# Patient Record
Sex: Female | Born: 1990 | Race: Black or African American | Hispanic: No | Marital: Married | Smoking: Never smoker
Health system: Southern US, Community
[De-identification: ages and names within clinical notes are randomized; demographics above are authoritative.]

## PROBLEM LIST (undated history)

## (undated) ENCOUNTER — Inpatient Hospital Stay (HOSPITAL_COMMUNITY): Payer: Self-pay

## (undated) DIAGNOSIS — Z789 Other specified health status: Secondary | ICD-10-CM

---

## 2012-12-26 ENCOUNTER — Encounter (HOSPITAL_COMMUNITY): Payer: Self-pay | Admitting: *Deleted

## 2012-12-26 ENCOUNTER — Emergency Department (HOSPITAL_COMMUNITY)
Admission: EM | Admit: 2012-12-26 | Discharge: 2012-12-26 | Disposition: A | Discharge status: Home / Self Care | Payer: BC Managed Care – PPO | Attending: Emergency Medicine | Admitting: Emergency Medicine

## 2012-12-26 ENCOUNTER — Emergency Department (HOSPITAL_COMMUNITY): Payer: BC Managed Care – PPO

## 2012-12-26 DIAGNOSIS — R209 Unspecified disturbances of skin sensation: Secondary | ICD-10-CM | POA: Insufficient documentation

## 2012-12-26 DIAGNOSIS — Y939 Activity, unspecified: Secondary | ICD-10-CM | POA: Insufficient documentation

## 2012-12-26 DIAGNOSIS — S83004A Unspecified dislocation of right patella, initial encounter: Secondary | ICD-10-CM

## 2012-12-26 DIAGNOSIS — M25461 Effusion, right knee: Secondary | ICD-10-CM

## 2012-12-26 DIAGNOSIS — M6281 Muscle weakness (generalized): Secondary | ICD-10-CM | POA: Insufficient documentation

## 2012-12-26 DIAGNOSIS — M25469 Effusion, unspecified knee: Secondary | ICD-10-CM | POA: Insufficient documentation

## 2012-12-26 DIAGNOSIS — S83006A Unspecified dislocation of unspecified patella, initial encounter: Secondary | ICD-10-CM | POA: Insufficient documentation

## 2012-12-26 DIAGNOSIS — Y929 Unspecified place or not applicable: Secondary | ICD-10-CM | POA: Insufficient documentation

## 2012-12-26 DIAGNOSIS — X500XXA Overexertion from strenuous movement or load, initial encounter: Secondary | ICD-10-CM | POA: Insufficient documentation

## 2012-12-26 MED ORDER — HYDROCODONE-ACETAMINOPHEN 5-325 MG PO TABS: 2.0000 | ORAL_TABLET | ORAL | Status: DC | PRN

## 2012-12-26 NOTE — ED Notes (Signed)
Reports that right knee cap slid out of place and pt fell to ground and now having pain. Swelling but No deformity noted at triage.

## 2012-12-26 NOTE — ED Notes (Signed)
Pt st's she turned from a standing position and right knee cap slid out of place.  Pt st's she fell to ground and knee cap went back into place.  Pt c/o pain in right knee with weight bearing at this time. No obvious deformity noted.

## 2012-12-26 NOTE — Progress Notes (Signed)
Orthopedic Tech Progress Note Patient Details:  Alexandria Ellis [birth year] [id number]  Ortho Devices Type of Ortho Device: Crutches;Knee Immobilizer Ortho Device/Splint Location: right leg Ortho Device/Splint Interventions: Application   Nikki Dom 12/26/2012, 10:51 PM

## 2012-12-26 NOTE — ED Provider Notes (Signed)
Medical screening examination/treatment/procedure(s) were conducted as a shared visit with non-physician practitioner(s) and myself.  I personally evaluated the patient during the encounter   Loren Racer, MD 12/26/12 [phone]

## 2012-12-26 NOTE — ED Provider Notes (Signed)
History    This chart was scribed for non-physician practitioner Dierdre Forth, PA-C working with Loren Racer, MD by Gerlean Ren, ED Scribe. This patient was seen in room TR11C/TR11C and the patient's care was started at 7:31 PM.    CSN: [id number]  Arrival date & time 12/26/12  1843   First MD Initiated Contact with Patient 12/26/12 1924      Chief Complaint  Patient presents with  . Knee Pain     The history is provided by the patient. No language interpreter was used.  Alexandria Ellis is a 22 y.o. female who presents to the Emergency Department complaining of sudden onset right knee pain at 5:30 PM today when pt rotated position while standing causing her knee cap to "slide out of place."  Pt reports her leg gave out on her at the time and she fell but did not sustain any injuries as result of fall.  Pt reports some numbness in her leg currently.  Pt denies any prior right knee injuries but states that her knee cap has slid out of place previously with similar movements.  Pt denies pain in lower back.  History reviewed. No pertinent past medical history.  History reviewed. No pertinent past surgical history.  History reviewed. No pertinent family history.  History  Substance Use Topics  . Smoking status: Not on file  . Smokeless tobacco: Not on file  . Alcohol Use: No    No OB history provided.   Review of Systems  Musculoskeletal: Positive for arthralgias (right knee).  Neurological: Positive for weakness and numbness.    Allergies  Review of patient's allergies indicates no known allergies.  Home Medications   Current Outpatient Rx  Name  Route  Sig  Dispense  Refill  . HYDROcodone-acetaminophen (NORCO/VICODIN) 5-325 MG per tablet   Oral   Take 2 tablets by mouth every 4 (four) hours as needed for pain.   10 tablet   0     BP 120/77  Pulse 78  Temp(Src) 98 F (36.7 C) (Oral)  Resp 16  SpO2 99%  LMP 12/23/2012  Physical Exam  Nursing note  and vitals reviewed. Constitutional: She is oriented to person, place, and time. She appears well-developed and well-nourished. No distress.  HENT:  Head: Normocephalic and atraumatic.  Eyes: Conjunctivae and EOM are normal.  Neck: Normal range of motion. Neck supple. No tracheal deviation present.  Cardiovascular: Normal rate, regular rhythm, normal heart sounds and intact distal pulses.   Pulses:      Dorsalis pedis pulses are 2+ on the right side, and 2+ on the left side.       Posterior tibial pulses are 2+ on the right side, and 2+ on the left side.  Capillary refill < 3 sec  Pulmonary/Chest: Effort normal and breath sounds normal. No respiratory distress.  Musculoskeletal: She exhibits tenderness. She exhibits no edema.       Right knee: She exhibits decreased range of motion, swelling and effusion. She exhibits no ecchymosis, no deformity, no laceration, no erythema, normal alignment and no bony tenderness. Tenderness found. Medial joint line tenderness noted.  Palpable effusion on right knee, decreased ROM of the Right knee 2/2 to pain  Neurological: She is alert and oriented to person, place, and time. Coordination normal. GCS eye subscore is 4. GCS verbal subscore is 5. GCS motor subscore is 6.  Reflex Scores:      Patellar reflexes are 2+ on the left side.  Achilles reflexes are 2+ on the left side. Weak with dorsiflexion and plantarflexion of left foot, 3/5 strength in right leg, unable to ambulate, absent sensation in L4 and L5 distribution below the knee, unable to assess reflex in right knee secondary to pain  Skin: Skin is warm and dry. She is not diaphoretic. No erythema.  No tenting of the skin, no erythema  Psychiatric: She has a normal mood and affect. Her behavior is normal.    ED Course  Procedures (including critical care time) DIAGNOSTIC STUDIES: Oxygen Saturation is 99% on room air, normal by my interpretation.    COORDINATION OF CARE: 7:50 PM- Informed pt  that I will have attending assess her due to sensory deficits.  Pt verbalizes understanding and agrees. Dg Knee Complete 4 Views Right  12/26/2012  *RADIOLOGY REPORT*  Clinical Data: Right knee pain.  RIGHT KNEE - COMPLETE 4+ VIEW  Comparison: None.  Findings: The right knee is located.  There is no significant effusion.  No acute bone or soft tissue abnormality is present.  IMPRESSION: Negative right knee.   Original Report Authenticated By: Marin Roberts, M.D.      1. Patellar dislocation, right, initial encounter   2. Knee effusion, right       MDM  Alexandria Ellis presents with knee injury, likely patella dislocation and subsequent knee effusion.  Patient X-Ray negative for obvious fracture or dislocation.  I personally reviewed the imaging tests through PACS system.  I reviewed available ER/hospitalization records through the EMR.  Pt also with mild swelling to the joint spaces, knee swelling, tightness in the knee, and mildly restricted range of motion. Pt unable to perform full flexion of the knee.  Pt is without systemic symptoms, erythema or redness of the joint consistent with gout or septic joint.  Pt with decreased sensation to the anterior and medial  right lower leg after the dislocation.  She is unable to ambulate.  Pt discussed with Dr Ranell Patrick who will see her in clinic tomorrow for follow-up.  Pain managed in ED. Pt advised to follow up with orthopedics if symptoms persist for possibility of missed fracture diagnosis. Patient given brace and crutches while in ED, conservative therapy recommended and discussed. Pt is to be nonweightbearing until evaluated by ortho.  Patient will be dc home & is agreeable with above plan.    I personally performed the services described in this documentation, which was scribed in my presence. The recorded information has been reviewed and is accurate.   Alexandria Client Belkis Norbeck, PA-C 12/26/12 2217

## 2013-01-06 ENCOUNTER — Emergency Department (HOSPITAL_COMMUNITY)
Admission: EM | Admit: 2013-01-06 | Discharge: 2013-01-06 | Disposition: A | Discharge status: Home / Self Care | Payer: BC Managed Care – PPO | Attending: Emergency Medicine | Admitting: Emergency Medicine

## 2013-01-06 ENCOUNTER — Emergency Department (HOSPITAL_COMMUNITY): Payer: BC Managed Care – PPO

## 2013-01-06 ENCOUNTER — Encounter (HOSPITAL_COMMUNITY): Payer: Self-pay | Admitting: Family Medicine

## 2013-01-06 DIAGNOSIS — Z87828 Personal history of other (healed) physical injury and trauma: Secondary | ICD-10-CM | POA: Insufficient documentation

## 2013-01-06 DIAGNOSIS — M25469 Effusion, unspecified knee: Secondary | ICD-10-CM | POA: Insufficient documentation

## 2013-01-06 DIAGNOSIS — M25461 Effusion, right knee: Secondary | ICD-10-CM

## 2013-01-06 NOTE — ED Provider Notes (Signed)
History    This chart was scribed for non-physician practitioner working with Shelda Jakes, MD by Frederik Pear, ED Scribe. This patient was seen in room TR08C/TR08C and the patient's care was started at 1647.   CSN: [id number]  Arrival date & time 01/06/13  1538   First MD Initiated Contact with Patient 01/06/13 1647      Chief Complaint  Patient presents with  . Joint Swelling    (Consider location/radiation/quality/duration/timing/severity/associated sxs/prior treatment) The history is provided by the patient and medical records. No language interpreter was used.   Alexandria Ellis is a 22 y.o. female who presents to the Emergency Department with a chief complaint of sudden onset,  6/10 right knee swelling that is aggravated by bearing weight and alleviated by nothing that began 2 weeks ago when she went to change directions while standing, and her knee slid out of place. She was evaluated in the ED on 04/02 and diagnosed with a right patellar dislocation and knee effusion and discharged with Vicodin and a follow up with Dr. Ranell Patrick on 04/03. In ED, she is using a knee immobilizer and crutches.   History reviewed. No pertinent past medical history.  History reviewed. No pertinent past surgical history.  History reviewed. No pertinent family history.  History  Substance Use Topics  . Smoking status: Not on file  . Smokeless tobacco: Not on file  . Alcohol Use: No    OB History   Grav Para Term Preterm Abortions TAB SAB Ect Mult Living                  Review of Systems A complete 10 system review of systems was obtained and all systems are negative except as noted in the HPI and PMH.  Allergies  Review of patient's allergies indicates no known allergies.  Home Medications   Current Outpatient Rx  Name  Route  Sig  Dispense  Refill  . HYDROcodone-acetaminophen (NORCO/VICODIN) 5-325 MG per tablet   Oral   Take 2 tablets by mouth every 4 (four) hours as needed  for pain.   10 tablet   0     BP 105/84  Pulse 81  Resp 18  SpO2 98%  LMP 01/06/2013  Physical Exam  Nursing note and vitals reviewed. Constitutional: She is oriented to person, place, and time. She appears well-developed and well-nourished. No distress.  HENT:  Head: Normocephalic and atraumatic.  Eyes: EOM are normal. Pupils are equal, round, and reactive to light.  Neck: Normal range of motion. Neck supple. No tracheal deviation present.  Cardiovascular: Normal rate.   Pulmonary/Chest: Effort normal. No respiratory distress.  Abdominal: Soft. She exhibits no distension.  Musculoskeletal: Normal range of motion. She exhibits no edema.  Right knee effusion mildly tender to palpation. No bony tenderness.   Neurological: She is alert and oriented to person, place, and time.  Skin: Skin is warm and dry.  Psychiatric: She has a normal mood and affect. Her behavior is normal.    ED Course  Procedures (including critical care time)  DIAGNOSTIC STUDIES: Oxygen Saturation is 98% on room air, normal by my interpretation.    COORDINATION OF CARE:  17:58- Discussed planned course of treatment with the patient, including an ortho referral, who is agreeable at this time.  Labs Reviewed - No data to display Dg Knee Complete 4 Views Right  01/06/2013  *RADIOLOGY REPORT*  Clinical Data: Right knee dislocation 2 weeks ago, pain/swelling  RIGHT KNEE - COMPLETE 4+ VIEW  Comparison: 12/26/2012  Findings: No fracture or dislocation is seen.  The joint spaces are preserved.  Moderate suprapatellar knee joint effusion.  IMPRESSION: No fracture or dislocation is seen.  Moderate suprapatellar knee joint effusion.   Original Report Authenticated By: Charline Bills, M.D.      1. Knee effusion, right       MDM  Uncomplicated knee effusion.  Patient using crutches and knee immobilizer. Ortho follow-up.  I personally performed the services described in this documentation, which was scribed  in my presence. The recorded information has been reviewed and is accurate.         Roxy Horseman, PA-C 01/07/13 0003

## 2013-01-06 NOTE — ED Notes (Signed)
Patient transported to X-ray 

## 2013-01-06 NOTE — ED Notes (Signed)
Per pt sts right knee swelling. Pt here a week ago with knee dislocation and fluid.

## 2013-01-08 NOTE — ED Provider Notes (Signed)
Medical screening examination/treatment/procedure(s) were performed by non-physician practitioner and as supervising physician I was immediately available for consultation/collaboration.    Loistine Eberlin W. Radhika Dershem, MD 01/08/13 1334 

## 2013-01-29 ENCOUNTER — Emergency Department (HOSPITAL_COMMUNITY)
Admission: EM | Admit: 2013-01-29 | Discharge: 2013-01-29 | Disposition: A | Discharge status: Home / Self Care | Payer: BC Managed Care – PPO | Attending: Emergency Medicine | Admitting: Emergency Medicine

## 2013-01-29 ENCOUNTER — Encounter (HOSPITAL_COMMUNITY): Payer: Self-pay

## 2013-01-29 DIAGNOSIS — R0789 Other chest pain: Secondary | ICD-10-CM | POA: Insufficient documentation

## 2013-01-29 DIAGNOSIS — F1910 Other psychoactive substance abuse, uncomplicated: Secondary | ICD-10-CM

## 2013-01-29 DIAGNOSIS — F1210 Cannabis abuse, uncomplicated: Secondary | ICD-10-CM | POA: Insufficient documentation

## 2013-01-29 DIAGNOSIS — R209 Unspecified disturbances of skin sensation: Secondary | ICD-10-CM | POA: Insufficient documentation

## 2013-01-29 DIAGNOSIS — F410 Panic disorder [episodic paroxysmal anxiety] without agoraphobia: Secondary | ICD-10-CM

## 2013-01-29 NOTE — ED Provider Notes (Signed)
History     CSN: [id number]  Arrival date & time 01/29/13  2142   First MD Initiated Contact with Patient 01/29/13 2309      Chief Complaint  Patient presents with  . Drug Problem  . Allergic Reaction     The history is provided by the patient.   patient reports smoking marijuana approximately 2 hours ago.  She then developed chest tightness a feeling of palpitations and tingling in her arms and fingers.  She felt pressure in her chest.  No shortness of breath.  No fevers or chills.  No abdominal pain.  No nausea vomiting or diarrhea.  No hallucinations.  This was  recreational drug use.  Her symptoms seem to be significantly improved at this time.  Nothing worsens her symptoms.  History reviewed. No pertinent past medical history.  History reviewed. No pertinent past surgical history.  History reviewed. No pertinent family history.  History  Substance Use Topics  . Smoking status: Not on file  . Smokeless tobacco: Not on file  . Alcohol Use: No    OB History   Grav Para Term Preterm Abortions TAB SAB Ect Mult Living                  Review of Systems  All other systems reviewed and are negative.    Allergies  Review of patient's allergies indicates no known allergies.  Home Medications   Current Outpatient Rx  Name  Route  Sig  Dispense  Refill  . HYDROcodone-acetaminophen (NORCO/VICODIN) 5-325 MG per tablet   Oral   Take 2 tablets by mouth every 4 (four) hours as needed for pain.   10 tablet   0     BP 116/83  Pulse 93  Temp(Src) 98.7 F (37.1 C) (Oral)  Resp 19  SpO2 100%  LMP 01/06/2013  Physical Exam  Nursing note and vitals reviewed. Constitutional: She is oriented to person, place, and time. She appears well-developed and well-nourished. No distress.  HENT:  Head: Normocephalic and atraumatic.  Eyes: EOM are normal.  Neck: Normal range of motion.  Cardiovascular: Normal rate, regular rhythm and normal heart sounds.   HR 93   Pulmonary/Chest: Effort normal and breath sounds normal.  Abdominal: Soft. She exhibits no distension. There is no tenderness.  Musculoskeletal: Normal range of motion.  Neurological: She is alert and oriented to person, place, and time.  Skin: Skin is warm and dry.  Psychiatric: She has a normal mood and affect. Judgment normal.    ED Course  Procedures (including critical care time)  Labs Reviewed - No data to display No results found.   1. Substance abuse   2. Panic attack       MDM  Sounds like it was a panic attack with associated anxiety secondary to drug abuse.  She will go home with her fianc.  No indication for additional treatment here.  Heart rate 93 on my exam and regular on auscultation.        Lyanne Co, MD 01/29/13 [phone]

## 2013-01-29 NOTE — ED Notes (Signed)
Per EMS, pt here with reaction to chronic about an hr and a half ago.  Pt started hyperventilating and became tachycardic.  Upon arrival, pt stating she is not able to feel extremities and that her face feels swollen.  Strengths in all extremities.  Swelling not noted to face.  Pt disoriented talking incoherently upon arrival and very euphoric.  States she has never had chronic before.

## 2013-01-29 NOTE — ED Notes (Signed)
Pt requesting something to drink.  Pt informed she would be seen by a doctor before anything can be given to her.

## 2013-06-25 ENCOUNTER — Encounter (HOSPITAL_COMMUNITY): Payer: Self-pay | Admitting: Emergency Medicine

## 2013-06-25 DIAGNOSIS — R0982 Postnasal drip: Secondary | ICD-10-CM | POA: Insufficient documentation

## 2013-06-25 DIAGNOSIS — H5789 Other specified disorders of eye and adnexa: Secondary | ICD-10-CM | POA: Insufficient documentation

## 2013-06-25 DIAGNOSIS — J3489 Other specified disorders of nose and nasal sinuses: Secondary | ICD-10-CM | POA: Insufficient documentation

## 2013-06-25 DIAGNOSIS — R05 Cough: Secondary | ICD-10-CM | POA: Insufficient documentation

## 2013-06-25 DIAGNOSIS — R059 Cough, unspecified: Secondary | ICD-10-CM | POA: Insufficient documentation

## 2013-06-25 DIAGNOSIS — IMO0001 Reserved for inherently not codable concepts without codable children: Secondary | ICD-10-CM | POA: Insufficient documentation

## 2013-06-25 DIAGNOSIS — B9789 Other viral agents as the cause of diseases classified elsewhere: Secondary | ICD-10-CM | POA: Insufficient documentation

## 2013-06-25 LAB — RAPID STREP SCREEN (MED CTR MEBANE ONLY): Streptococcus, Group A Screen (Direct): NEGATIVE

## 2013-06-25 NOTE — ED Notes (Signed)
Pt. reports sore throat with intermittent productive cough and generalized body malaise for 1 week .

## 2013-06-26 ENCOUNTER — Emergency Department (HOSPITAL_COMMUNITY)
Admission: EM | Admit: 2013-06-26 | Discharge: 2013-06-26 | Disposition: A | Discharge status: Home / Self Care | Payer: BC Managed Care – PPO | Attending: Emergency Medicine | Admitting: Emergency Medicine

## 2013-06-26 DIAGNOSIS — B349 Viral infection, unspecified: Secondary | ICD-10-CM

## 2013-06-26 MED ORDER — PSEUDOEPHEDRINE HCL ER 120 MG PO TB12: 120.0000 mg | ORAL_TABLET | Freq: Two times a day (BID) | ORAL | Status: DC

## 2013-06-26 MED ORDER — SALINE SPRAY 0.65 % NA SOLN
1.0000 | Freq: Once | NASAL | Status: AC
  Administered 2013-06-26: 1 via NASAL
  Filled 2013-06-26: qty 44

## 2013-06-26 MED ORDER — IBUPROFEN 600 MG PO TABS: 600.0000 mg | ORAL_TABLET | Freq: Four times a day (QID) | ORAL | Status: DC | PRN

## 2013-06-26 MED ORDER — PSEUDOEPHEDRINE HCL ER 120 MG PO TB12
120.0000 mg | ORAL_TABLET | Freq: Once | ORAL | Status: AC
  Administered 2013-06-26: 120 mg via ORAL
  Filled 2013-06-26: qty 1

## 2013-06-26 MED ORDER — IBUPROFEN 400 MG PO TABS
600.0000 mg | ORAL_TABLET | Freq: Once | ORAL | Status: AC
  Administered 2013-06-26: 600 mg via ORAL
  Filled 2013-06-26 (×2): qty 1

## 2013-06-26 NOTE — ED Provider Notes (Signed)
Medical screening examination/treatment/procedure(s) were performed by non-physician practitioner and as supervising physician I was immediately available for consultation/collaboration.  Flint Melter, MD 06/26/13 1059

## 2013-06-26 NOTE — ED Provider Notes (Signed)
CSN: [id number]     Arrival date & time 06/25/13  2248 History   First MD Initiated Contact with Patient 06/26/13 [phone]     Chief Complaint  Patient presents with  . Sore Throat   (Consider location/radiation/quality/duration/timing/severity/associated sxs/prior Treatment) HPI Comments: Vision has had URI symptoms for the past, week, including nasal congestion, sore throat, postnasal drip, occasional n, cough, mostly in the supine position.  She took one dose of over-the-counter ibuprofen.  Several days ago.  Other than, that she's been drinking hot tea.  Her symptoms.  Presents to the emergency room tonight because of increased throat pain  Patient is a 22 y.o. female presenting with pharyngitis. The history is provided by the patient.  Sore Throat This is a new problem. The current episode started in the past 7 days. The problem occurs intermittently. The problem has been unchanged. Associated symptoms include congestion, coughing, myalgias and a sore throat. Pertinent negatives include no abdominal pain, fever, headaches, nausea, rash or swollen glands. The symptoms are aggravated by swallowing. Treatments tried: Drinking tea. The treatment provided no relief.    History reviewed. No pertinent past medical history. History reviewed. No pertinent past surgical history. No family history on file. History  Substance Use Topics  . Smoking status: Never Smoker   . Smokeless tobacco: Not on file  . Alcohol Use: No   OB History   Grav Para Term Preterm Abortions TAB SAB Ect Mult Living                 Review of Systems  Constitutional: Negative for fever.  HENT: Positive for congestion, sore throat, rhinorrhea and postnasal drip. Negative for ear pain and trouble swallowing.   Eyes: Positive for redness.  Respiratory: Positive for cough.   Gastrointestinal: Negative for nausea and abdominal pain.  Musculoskeletal: Positive for myalgias.  Skin: Negative for rash.  Neurological:  Negative for dizziness and headaches.  All other systems reviewed and are negative.    Allergies  Review of patient's allergies indicates no known allergies.  Home Medications  No current outpatient prescriptions on file. BP 126/78  Pulse 80  Temp(Src) 98.2 F (36.8 C) (Oral)  Resp 14  SpO2 99%  LMP 06/23/2013 Physical Exam  Nursing note and vitals reviewed. Constitutional: She is oriented to person, place, and time. She appears well-developed and well-nourished.  HENT:  Head: Normocephalic.  Right Ear: External ear normal.  Left Ear: External ear normal.  Mouth/Throat: Uvula is midline. No trismus in the jaw. No edematous. Posterior oropharyngeal erythema present. No oropharyngeal exudate.  Neck: Normal range of motion.  Cardiovascular: Normal rate.   Pulmonary/Chest: Effort normal.  Abdominal: Soft.  Lymphadenopathy:    She has no cervical adenopathy.  Neurological: She is alert and oriented to person, place, and time.  Skin: Skin is warm.    ED Course  Procedures (including critical care time) Labs Review Labs Reviewed  RAPID STREP SCREEN  CULTURE, GROUP A STREP   Imaging Review No results found.  MDM  No diagnosis found. A viral syndrome   Arman Filter, NP 06/26/13 [phone]

## 2013-06-27 LAB — CULTURE, GROUP A STREP

## 2013-08-08 ENCOUNTER — Emergency Department (HOSPITAL_COMMUNITY)
Admission: EM | Admit: 2013-08-08 | Discharge: 2013-08-08 | Disposition: A | Discharge status: Home / Self Care | Payer: BC Managed Care – PPO | Attending: Emergency Medicine | Admitting: Emergency Medicine

## 2013-08-08 ENCOUNTER — Encounter (HOSPITAL_COMMUNITY): Payer: Self-pay | Admitting: Emergency Medicine

## 2013-08-08 DIAGNOSIS — N938 Other specified abnormal uterine and vaginal bleeding: Secondary | ICD-10-CM | POA: Insufficient documentation

## 2013-08-08 DIAGNOSIS — B9689 Other specified bacterial agents as the cause of diseases classified elsewhere: Secondary | ICD-10-CM | POA: Insufficient documentation

## 2013-08-08 DIAGNOSIS — F17200 Nicotine dependence, unspecified, uncomplicated: Secondary | ICD-10-CM | POA: Insufficient documentation

## 2013-08-08 DIAGNOSIS — A499 Bacterial infection, unspecified: Secondary | ICD-10-CM | POA: Insufficient documentation

## 2013-08-08 DIAGNOSIS — N949 Unspecified condition associated with female genital organs and menstrual cycle: Secondary | ICD-10-CM | POA: Insufficient documentation

## 2013-08-08 DIAGNOSIS — N939 Abnormal uterine and vaginal bleeding, unspecified: Secondary | ICD-10-CM

## 2013-08-08 DIAGNOSIS — N760 Acute vaginitis: Secondary | ICD-10-CM | POA: Insufficient documentation

## 2013-08-08 LAB — WET PREP, GENITAL

## 2013-08-08 MED ORDER — AZITHROMYCIN 250 MG PO TABS
1000.0000 mg | ORAL_TABLET | Freq: Once | ORAL | Status: AC
  Administered 2013-08-08: 1000 mg via ORAL
  Filled 2013-08-08: qty 4

## 2013-08-08 MED ORDER — METRONIDAZOLE 500 MG PO TABS: 500.0000 mg | ORAL_TABLET | Freq: Two times a day (BID) | ORAL | Status: DC

## 2013-08-08 MED ORDER — METRONIDAZOLE 500 MG PO TABS
500.0000 mg | ORAL_TABLET | Freq: Once | ORAL | Status: AC
  Administered 2013-08-08: 500 mg via ORAL
  Filled 2013-08-08: qty 1

## 2013-08-08 MED ORDER — LIDOCAINE HCL (PF) 1 % IJ SOLN
INTRAMUSCULAR | Status: AC
  Administered 2013-08-08: 2 mL
  Filled 2013-08-08: qty 5

## 2013-08-08 MED ORDER — CEFTRIAXONE SODIUM 250 MG IJ SOLR
250.0000 mg | Freq: Once | INTRAMUSCULAR | Status: AC
  Administered 2013-08-08: 250 mg via INTRAMUSCULAR
  Filled 2013-08-08: qty 250

## 2013-08-08 NOTE — ED Notes (Signed)
Patient with abnormal vaginal bleeding that started today.  Patient states it is not time for her period at this time.

## 2013-08-08 NOTE — ED Notes (Signed)
Waiting 15 min then d/c due to IM injection

## 2013-08-08 NOTE — ED Provider Notes (Signed)
CSN: [id number]     Arrival date & time 08/08/13  1902 History   First MD Initiated Contact with Patient 08/08/13 2122     Chief Complaint  Patient presents with  . Vaginal Bleeding   (Consider location/radiation/quality/duration/timing/severity/associated sxs/prior Treatment) HPI Comments: Patient to the emergency room tonight stating she wants to be tested for "everything."  She reports, that a condom burst 3, days, ago.l .  Her last menstrual cycle was October 25 which was abnormal for her.  She reports during that menstrual cycle.  She had one day of irregular bleeding, but denies any discharge, dysuria, back pain, fell over.  No history of STD, in the past  Patient is a 22 y.o. female presenting with vaginal bleeding. The history is provided by the patient.  Vaginal Bleeding Quality:  Lighter than menses Severity:  Mild Onset quality:  Gradual Duration:  2 days Timing:  Constant Progression:  Unchanged Chronicity:  Recurrent Menstrual history:  Regular Number of pads used:  2 Context: spontaneously   Relieved by:  None tried Worsened by:  Activity Ineffective treatments:  None tried Associated symptoms: no abdominal pain, no back pain, no dyspareunia, no dysuria, no nausea and no vaginal discharge     History reviewed. No pertinent past medical history. History reviewed. No pertinent past surgical history. History reviewed. No pertinent family history. History  Substance Use Topics  . Smoking status: Current Some Day Smoker  . Smokeless tobacco: Not on file  . Alcohol Use: Yes     Comment: socially   OB History   Grav Para Term Preterm Abortions TAB SAB Ect Mult Living                 Review of Systems  Gastrointestinal: Negative for nausea and abdominal pain.  Genitourinary: Positive for vaginal bleeding. Negative for dysuria, vaginal discharge and dyspareunia.  Musculoskeletal: Negative for back pain.    Allergies  Review of patient's allergies indicates no  known allergies.  Home Medications   Current Outpatient Rx  Name  Route  Sig  Dispense  Refill  . metroNIDAZOLE (FLAGYL) 500 MG tablet   Oral   Take 1 tablet (500 mg total) by mouth 2 (two) times daily.   13 tablet   0    BP 129/78  Pulse 88  Temp(Src) 98.2 F (36.8 C) (Oral)  Resp 16  Ht 5\' 8"  (1.727 m)  Wt 185 lb 11.2 oz (84.233 kg)  BMI 28.24 kg/m2  SpO2 99%  LMP 07/21/2013 Physical Exam  Nursing note and vitals reviewed. Constitutional: She appears well-developed and well-nourished.  HENT:  Head: Normocephalic.  Eyes: Pupils are equal, round, and reactive to light.  Cardiovascular: Normal rate and regular rhythm.   Pulmonary/Chest: Effort normal and breath sounds normal.  Abdominal: Soft. Bowel sounds are normal.  Genitourinary: Cervix exhibits no discharge. Right adnexum displays no mass and no tenderness. Left adnexum displays no mass. There is bleeding around the vagina. No vaginal discharge found.  Musculoskeletal: Normal range of motion.  Neurological: She is alert.  Skin: Skin is warm.    ED Course  Procedures (including critical care time) Labs Review Labs Reviewed  WET PREP, GENITAL - Abnormal; Notable for the following:    Clue Cells Wet Prep HPF POC MODERATE (*)    WBC, Wet Prep HPF POC FEW (*)    All other components within normal limits  GC/CHLAMYDIA PROBE AMP   Imaging Review No results found.  EKG Interpretation   None  MDM  Wet prep, reviewed, shows positive clue cells, bedside pregnancy test is negative.  Patient has been treated for potential, GC, Chlamydia, and gonorrhea, and she received her first dose of Flagyl.  In the emergency department to start treating bacterial vaginosis.  It is recommended that she followup with the [facility] 1. Bacterial vaginal infection   2. Abnormal vaginal bleeding        Arman Filter, NP 08/08/13 2245  Arman Filter, NP 08/08/13 2245

## 2013-08-09 LAB — GC/CHLAMYDIA PROBE AMP
CT Probe RNA: NEGATIVE
GC Probe RNA: NEGATIVE

## 2013-08-13 NOTE — ED Provider Notes (Signed)
Medical screening examination/treatment/procedure(s) were performed by non-physician practitioner and as supervising physician I was immediately available for consultation/collaboration.  EKG Interpretation   None        Christopher J. Pollina, MD 08/13/13 1502 

## 2014-02-19 ENCOUNTER — Other Ambulatory Visit (HOSPITAL_COMMUNITY)
Admission: RE | Admit: 2014-02-19 | Discharge: 2014-02-19 | Disposition: A | Discharge status: Home / Self Care | Payer: BC Managed Care – PPO | Source: Ambulatory Visit | Attending: Emergency Medicine | Admitting: Emergency Medicine

## 2014-02-19 ENCOUNTER — Encounter (HOSPITAL_COMMUNITY): Payer: Self-pay | Admitting: Emergency Medicine

## 2014-02-19 ENCOUNTER — Emergency Department (INDEPENDENT_AMBULATORY_CARE_PROVIDER_SITE_OTHER)
Admission: EM | Admit: 2014-02-19 | Discharge: 2014-02-19 | Disposition: A | Discharge status: Home / Self Care | Payer: BC Managed Care – PPO | Source: Home / Self Care | Attending: Family Medicine | Admitting: Family Medicine

## 2014-02-19 DIAGNOSIS — Z113 Encounter for screening for infections with a predominantly sexual mode of transmission: Secondary | ICD-10-CM

## 2014-02-19 DIAGNOSIS — N760 Acute vaginitis: Secondary | ICD-10-CM | POA: Insufficient documentation

## 2014-02-19 DIAGNOSIS — Z202 Contact with and (suspected) exposure to infections with a predominantly sexual mode of transmission: Secondary | ICD-10-CM

## 2014-02-19 LAB — POCT URINALYSIS DIP (DEVICE)
Bilirubin Urine: NEGATIVE
Glucose, UA: NEGATIVE mg/dL
KETONES UR: NEGATIVE mg/dL
Nitrite: NEGATIVE
PH: 6.5 (ref 5.0–8.0)
PROTEIN: NEGATIVE mg/dL
SPECIFIC GRAVITY, URINE: 1.015 (ref 1.005–1.030)
UROBILINOGEN UA: 0.2 mg/dL (ref 0.0–1.0)

## 2014-02-19 LAB — RPR

## 2014-02-19 LAB — HIV ANTIBODY (ROUTINE TESTING W REFLEX): HIV 1&2 Ab, 4th Generation: NONREACTIVE

## 2014-02-19 LAB — POCT PREGNANCY, URINE: PREG TEST UR: NEGATIVE

## 2014-02-19 NOTE — ED Notes (Signed)
Patient reports abdominal discomfort for 1-2 days, denies vaginal discharge, denies any std symptoms, but is requesting std evaluation

## 2014-02-19 NOTE — ED Provider Notes (Signed)
CSN: [id number]     Arrival date & time 02/19/14  1256 History   First MD Initiated Contact with Patient 02/19/14 1353     Chief Complaint  Patient presents with  . SEXUALLY TRANSMITTED DISEASE   (Consider location/radiation/quality/duration/timing/severity/associated sxs/prior Treatment) Patient is a 23 y.o. female presenting with STD exposure. The history is provided by the patient. No language interpreter was used.  Exposure to STD This is a new problem. Nothing relieves the symptoms. She has tried nothing for the symptoms.  Pt denies any problems.   She wants to be screened for Std's.   Pt reports no problems  History reviewed. No pertinent past medical history. History reviewed. No pertinent past surgical history. No family history on file. History  Substance Use Topics  . Smoking status: Never Smoker   . Smokeless tobacco: Not on file  . Alcohol Use: Yes     Comment: socially   OB History   Grav Para Term Preterm Abortions TAB SAB Ect Mult Living                 Review of Systems  All other systems reviewed and are negative.   Allergies  Review of patient's allergies indicates no known allergies.  Home Medications   Prior to Admission medications   Medication Sig Start Date End Date Taking? Authorizing Provider  metroNIDAZOLE (FLAGYL) 500 MG tablet Take 1 tablet (500 mg total) by mouth 2 (two) times daily. 08/08/13   Arman Filter, NP   BP 123/83  Pulse 70  Temp(Src) 97.7 F (36.5 C) (Oral)  Resp 18  SpO2 99%  LMP 02/15/2014 Physical Exam  Nursing note and vitals reviewed. Constitutional: She is oriented to person, place, and time. She appears well-developed and well-nourished.  HENT:  Head: Normocephalic.  Eyes: EOM are normal. Pupils are equal, round, and reactive to light.  Neck: Normal range of motion.  Cardiovascular: Normal rate and regular rhythm.   Pulmonary/Chest: Effort normal and breath sounds normal.  Abdominal: Soft. She exhibits no  distension.  Genitourinary: Vagina normal. No vaginal discharge found.  Musculoskeletal: Normal range of motion.  Neurological: She is alert and oriented to person, place, and time.  Skin: Skin is warm.  Psychiatric: She has a normal mood and affect.    ED Course  Procedures (including critical care time) Labs Review Labs Reviewed  POCT URINALYSIS DIP (DEVICE) - Abnormal; Notable for the following:    Hgb urine dipstick MODERATE (*)    Leukocytes, UA TRACE (*)    All other components within normal limits  RPR  HIV ANTIBODY (ROUTINE TESTING)  POCT PREGNANCY, URINE  CERVICOVAGINAL ANCILLARY ONLY    Imaging Review No results found.   MDM   1. Screening for STDs (sexually transmitted diseases)       Elson Areas, PA-C 02/19/14 1446

## 2014-02-20 NOTE — ED Provider Notes (Signed)
Medical screening examination/treatment/procedure(s) were performed by a resident physician or non-physician practitioner and as the supervising physician I was immediately available for consultation/collaboration.  Taina Landry, MD    Laurieann Friddle S Zianna Dercole, MD 02/20/14 0800 

## 2014-03-01 NOTE — ED Notes (Signed)
Final report positive for gardnerella , treatment adequate w flagyl

## 2014-03-07 ENCOUNTER — Telehealth (HOSPITAL_COMMUNITY): Payer: Self-pay | Admitting: *Deleted

## 2014-03-07 NOTE — ED Notes (Signed)
GC/Chlamydia neg., Affirm: Candida and Trich neg., Gardnerella pos. Message sent to NCR CorporationKaren Sofia PA.  She ordered Flagyl 500 mg. BID #14.  I called pt. Pt. verified x 2 and given results.  Pt. told she needs Flagyl for bacterial vaginosis.   Pt. instructed to no alcohol while taking this medication.  Pt. wants Rx[facility].  I called Rx. to VM @ [phone]. Vassie MoselleYork, Malissia Rabbani M 03/07/2014

## 2014-09-29 ENCOUNTER — Emergency Department (HOSPITAL_COMMUNITY)
Admission: EM | Admit: 2014-09-29 | Discharge: 2014-09-29 | Disposition: A | Discharge status: Home / Self Care | Payer: BC Managed Care – PPO | Attending: Emergency Medicine | Admitting: Emergency Medicine

## 2014-09-29 ENCOUNTER — Encounter (HOSPITAL_COMMUNITY): Payer: Self-pay | Admitting: Cardiology

## 2014-09-29 DIAGNOSIS — Z79899 Other long term (current) drug therapy: Secondary | ICD-10-CM | POA: Diagnosis not present

## 2014-09-29 DIAGNOSIS — N946 Dysmenorrhea, unspecified: Secondary | ICD-10-CM | POA: Insufficient documentation

## 2014-09-29 DIAGNOSIS — R109 Unspecified abdominal pain: Secondary | ICD-10-CM | POA: Diagnosis present

## 2014-09-29 LAB — CBC WITH DIFFERENTIAL/PLATELET
Basophils Absolute: 0 10*3/uL (ref 0.0–0.1)
Basophils Relative: 0 % (ref 0–1)
EOS ABS: 0 10*3/uL (ref 0.0–0.7)
Eosinophils Relative: 0 % (ref 0–5)
HEMATOCRIT: 40.6 % (ref 36.0–46.0)
Hemoglobin: 13.4 g/dL (ref 12.0–15.0)
LYMPHS ABS: 1.1 10*3/uL (ref 0.7–4.0)
LYMPHS PCT: 12 % (ref 12–46)
MCH: 29.6 pg (ref 26.0–34.0)
MCHC: 33 g/dL (ref 30.0–36.0)
MCV: 89.8 fL (ref 78.0–100.0)
MONO ABS: 0.3 10*3/uL (ref 0.1–1.0)
MONOS PCT: 4 % (ref 3–12)
Neutro Abs: 7.7 10*3/uL (ref 1.7–7.7)
Neutrophils Relative %: 84 % — ABNORMAL HIGH (ref 43–77)
PLATELETS: 221 10*3/uL (ref 150–400)
RBC: 4.52 MIL/uL (ref 3.87–5.11)
RDW: 12.8 % (ref 11.5–15.5)
WBC: 9.2 10*3/uL (ref 4.0–10.5)

## 2014-09-29 LAB — URINALYSIS, ROUTINE W REFLEX MICROSCOPIC
Glucose, UA: NEGATIVE mg/dL
Ketones, ur: 40 mg/dL — AB
Leukocytes, UA: NEGATIVE
Nitrite: NEGATIVE
Protein, ur: NEGATIVE mg/dL
Specific Gravity, Urine: 1.029 (ref 1.005–1.030)
Urobilinogen, UA: 1 mg/dL (ref 0.0–1.0)
pH: 6 (ref 5.0–8.0)

## 2014-09-29 LAB — COMPREHENSIVE METABOLIC PANEL
ALT: 10 U/L (ref 0–35)
AST: 14 U/L (ref 0–37)
Albumin: 4.1 g/dL (ref 3.5–5.2)
Alkaline Phosphatase: 49 U/L (ref 39–117)
Anion gap: 8 (ref 5–15)
BUN: 6 mg/dL (ref 6–23)
CO2: 23 mmol/L (ref 19–32)
Calcium: 9.2 mg/dL (ref 8.4–10.5)
Chloride: 108 mEq/L (ref 96–112)
Creatinine, Ser: 0.88 mg/dL (ref 0.50–1.10)
GFR calc Af Amer: >90 mL/min (ref >90)
GFR calc non Af Amer: >90 mL/min (ref >90)
Glucose, Bld: 100 mg/dL — ABNORMAL HIGH (ref 70–99)
Potassium: 3.6 mmol/L (ref 3.5–5.1)
Sodium: 139 mmol/L (ref 135–145)
Total Bilirubin: 0.8 mg/dL (ref 0.3–1.2)
Total Protein: 6.8 g/dL (ref 6.0–8.3)

## 2014-09-29 LAB — URINE MICROSCOPIC-ADD ON

## 2014-09-29 LAB — I-STAT BETA HCG BLOOD, ED (MC, WL, AP ONLY): I-stat hCG, quantitative: <5 m[IU]/mL (ref <5)

## 2014-09-29 LAB — LIPASE, BLOOD: Lipase: 22 U/L (ref 11–59)

## 2014-09-29 MED ORDER — KETOROLAC TROMETHAMINE 60 MG/2ML IM SOLN
30.0000 mg | Freq: Once | INTRAMUSCULAR | Status: AC
  Administered 2014-09-29: 30 mg via INTRAMUSCULAR
  Filled 2014-09-29: qty 2

## 2014-09-29 NOTE — Discharge Instructions (Signed)
For pain control please take ibuprofen (also known as Motrin or Advil)  (this is normally 4 over the counter pills) 3 times a day  for 5 days. Take with food to minimize stomach irritation.  Do not hesitate to return to the emergency room for any new, worsening or concerning symptoms.  Please obtain primary care using resource guide below. But the minute you were seen in the emergency room and that they will need to obtain records for further outpatient management.   Dysmenorrhea Menstrual cramps (dysmenorrhea) are caused by the muscles of the uterus tightening (contracting) during a menstrual period. For some women, this discomfort is merely bothersome. For others, dysmenorrhea can be severe enough to interfere with everyday activities for a few days each month. Primary dysmenorrhea is menstrual cramps that last a couple of days when you start having menstrual periods or soon after. This often begins after a teenager starts having her period. As a woman gets older or has a baby, the cramps will usually lessen or disappear. Secondary dysmenorrhea begins later in life, lasts longer, and the pain may be stronger than primary dysmenorrhea. The pain may start before the period and last a few days after the period.  CAUSES  Dysmenorrhea is usually caused by an underlying problem, such as:  The tissue lining the uterus grows outside of the uterus in other areas of the body (endometriosis).  The endometrial tissue, which normally lines the uterus, is found in or grows into the muscular walls of the uterus (adenomyosis).  The pelvic blood vessels are engorged with blood just before the menstrual period (pelvic congestive syndrome).  Overgrowth of cells (polyps) in the lining of the uterus or cervix.  Falling down of the uterus (prolapse) because of loose or stretched ligaments.  Depression.  Bladder problems, infection, or inflammation.  Problems with the intestine, a tumor, or irritable  bowel syndrome.  Cancer of the female organs or bladder.  A severely tipped uterus.  A very tight opening or closed cervix.  Noncancerous tumors of the uterus (fibroids).  Pelvic inflammatory disease (PID).  Pelvic scarring (adhesions) from a previous surgery.  Ovarian cyst.  An intrauterine device (IUD) used for birth control. RISK FACTORS You may be at greater risk of dysmenorrhea if:  You are younger than age 78.  You started puberty early.  You have irregular or heavy bleeding.  You have never given birth.  You have a family history of this problem.  You are a smoker. SIGNS AND SYMPTOMS   Cramping or throbbing pain in your lower abdomen.  Headaches.  Lower back pain.  Nausea or vomiting.  Diarrhea.  Sweating or dizziness.  Loose stools. DIAGNOSIS  A diagnosis is based on your history, symptoms, physical exam, diagnostic tests, or procedures. Diagnostic tests or procedures may include:  Blood tests.  Ultrasonography.  An examination of the lining of the uterus (dilation and curettage, D&C).  An examination inside your abdomen or pelvis with a scope (laparoscopy).  X-rays.  CT scan.  MRI.  An examination inside the bladder with a scope (cystoscopy).  An examination inside the intestine or stomach with a scope (colonoscopy, gastroscopy). TREATMENT  Treatment depends on the cause of the dysmenorrhea. Treatment may include:  Pain medicine prescribed by your health care provider.  Birth control pills or an IUD with progesterone hormone in it.  Hormone replacement therapy.  Nonsteroidal anti-inflammatory drugs (NSAIDs). These may help stop the production of prostaglandins.  Surgery to remove adhesions, endometriosis, ovarian cyst,  or fibroids.  Removal of the uterus (hysterectomy).  Progesterone shots to stop the menstrual period.  Cutting the nerves on the sacrum that go to the female organs (presacral neurectomy).  Electric current  to the sacral nerves (sacral nerve stimulation).  Antidepressant medicine.  Psychiatric therapy, counseling, or group therapy.  Exercise and physical therapy.  Meditation and yoga therapy.  Acupuncture. HOME CARE INSTRUCTIONS   Only take over-the-counter or prescription medicines as directed by your health care provider.  Place a heating pad or hot water bottle on your lower back or abdomen. Do not sleep with the heating pad.  Use aerobic exercises, walking, swimming, biking, and other exercises to help lessen the cramping.  Massage to the lower back or abdomen may help.  Stop smoking.  Avoid alcohol and caffeine. SEEK MEDICAL CARE IF:   Your pain does not get better with medicine.  You have pain with sexual intercourse.  Your pain increases and is not controlled with medicines.  You have abnormal vaginal bleeding with your period.  You develop nausea or vomiting with your period that is not controlled with medicine. SEEK IMMEDIATE MEDICAL CARE IF:  You pass out.  Document Released: 09/12/2005 Document Revised: 05/15/2013 Document Reviewed: 02/28/2013 [facility] Patient Information 2015 [place], [place]. This information is not intended to replace advice given to you by your health care provider. Make sure you discuss any questions you have with your health care provider.  Emergency Department Resource Guide 1) Find a Doctor and Pay Out of Pocket Although you won't have to find out who is covered by your insurance plan, it is a good idea to ask around and get recommendations. You will then need to call the office and see if the doctor you have chosen will accept you as a new patient and what types of options they offer for patients who are self-pay. Some doctors offer discounts or will set up payment plans for their patients who do not have insurance, but you will need to ask so you aren't surprised when you get to your appointment.  2) Contact Your Local Health  Department Not all health departments have doctors that can see patients for sick visits, but many do, so it is worth a call to see if yours does. If you don't know where your local health department is, you can check in your phone book. The CDC also has a tool to help you locate your state's health department, and many state websites also have listings of all of their local health departments.  3) Find a Walk-in Clinic If your illness is not likely to be very severe or complicated, you may want to try a walk in clinic. These are popping up all over the country in pharmacies, drugstores, and shopping centers. They're usually staffed by nurse practitioners or physician assistants that have been trained to treat common illnesses and complaints. They're usually fairly quick and inexpensive. However, if you have serious medical issues or chronic medical problems, these are probably not your best option.  No Primary Care Doctor: - Call Health Connect at  [phone] - they can help you locate a primary care doctor that  accepts your insurance, provides certain services, etc. - Physician Referral Service- [phone]  Chronic Pain Problems: Organization         Address  Phone   Notes  [facility]  [phone] Patients need to be referred by their primary care doctor.   Medication Assistance: Organization  Address  Phone   Notes  [facility] [address] [place], [place] [address] [phone] --Must be a resident of [facility] -- Must have NO insurance coverage whatsoever (no Medicaid/ Medicare, etc.) -- The pt. MUST have a primary care doctor that directs their care regularly and follows them in the community   MedAssist  [phone]   [employer]  [phone]    Agencies that provide inexpensive medical care: Organization         Address  Phone   Notes  [place]  [phone]   [facility]    [phone]   [facility] [place], [place] [address] [phone]   [place] [address] [place] [phone]   Planned Parenthood    [phone]   [facility]    [phone]   [place] [address], [place] Phone:  [phone], Fax:  [phone] Hours of Operation:  9 am - 6 pm, M-F.  Also accepts Medicaid/Medicare and self-pay.  [facility] for [address], [address], [place] Phone: [phone], Fax: [phone]. Hours of Operation:  8:30 am - 5:30 pm, M-F.  Also accepts Medicaid and self-pay.  [facility] [place] [address] [place] Phone: [phone]   [place], [place], [place] [phone], Ext. [phone] Mondays & Thursdays: 7-9 AM.  First 15 patients are seen on a first come, first serve basis.    [place] Providers:  Organization         Address  Phone   Notes  [facility] [address], [place] [phone] Also accepts self-pay patients.  [facility] [address], [place]  [phone]   [place], [address], [place] [phone]   [facility] [address] [place] [phone]   Lucianne Lei [address], [place]   [phone] Only accepts [place] Access [place] patients after they have their name applied to their card.   Self-Pay (no insurance) in [facility]:  Organization         Address  Phone   Notes  Sickle Cell Patients, [facility] [place] [phone]   [facility] [place] [phone]   [facility] [place]  [place], [place],  [place] [phone]   [facility]/Dr. Osei-Bonsu  [address], [place] or [address], [place] [phone] Phone number for both [place] and [place] locations is the same.  Urgent Medical and [facility] [address], [place] [phone]   [facility] [address] [place] or [address] [phone]   [facility] [address] [place] [phone], phone; [phone], fax Sees patients 1st and 3rd Saturday of every month.  Must not qualify for public or private insurance (i.e. Medicaid, Medicare, [place] Health Choice, Veterans' Benefits)  Household income should be no more than 200% of the poverty level The clinic cannot treat you if you are pregnant or think you  are pregnant  Sexually transmitted diseases are not treated at the clinic.    Dental Care: Organization         Address  Phone  Notes  [facility] Department of [facility] [place] [phone] Accepts children up to age 78 who are enrolled in [place] or [place]; pregnant women with a Medicaid card; and children who have applied for Medicaid or [place] Health Choice, but were declined, whose parents can pay a reduced fee at time of service.  [facility] Department of [facility]  [address], [place] [phone] Accepts children up to age 62 who are enrolled in [place] or [place]; pregnant women with a Medicaid card; and children who have applied for Medicaid or [place] Health Choice, but were declined, whose parents can pay a reduced fee at time of service.  [place] Adult Dental Access PROGRAM  [place] [phone] Patients are seen by appointment only. Walk-ins are not accepted. [place] will see patients 69 years of age and older. Monday - Tuesday (8am-5pm) Most Wednesdays  (8:30-5pm) $30 per visit, cash only  [facility] Adult Dental Access PROGRAM  [address], [facility] [phone] Patients are seen by appointment only. Walk-ins are not accepted. [place] will see patients 16 years of age and older. One Wednesday Evening (Monthly: Volunteer Based).  $30 per visit, cash only  [place]  [phone] for adults; Children under age 11, call Graduate Pediatric Dentistry at [phone]. Children aged 47-14, please call [phone] to request a pediatric application.  Dental services are provided in all areas of dental care including fillings, crowns and bridges, complete and partial dentures, implants, gum treatment, root canals, and extractions. Preventive care is also provided. Treatment is provided to both adults and children. Patients are selected via a lottery and there is often a waiting list.   [facility] [address] [place]  [phone] [email or web address]   [facility] [address] [place], [place] [phone], Ext. [phone] Second and Fourth Thursday of each month, opens at 6:30 AM; Clinic ends at 9 AM.  Patients are seen on a first-come first-served basis, and a limited number are seen during each clinic.   [facility]  [address] Hillard Danker [place], [place] [phone]   Eligibility Requirements You must have lived in [place], [place], or [place] counties for at least the last three months.   You cannot be eligible for state or federal sponsored [employer], including [employer], [place], or [employer].   You generally cannot be eligible for healthcare insurance through your employer.    How to apply: Eligibility screenings are held every Tuesday and Wednesday afternoon from 1:00 pm until 4:00 pm. You do not need an appointment for the interview!  [facility] [address] [place], [place]   [place]  [place] Department  [place]  [phone]    Behavioral Health Resources in the Community: Intensive Outpatient Programs Organization         Address  Phone  Notes  [place]. [address], [place], [place] [phone]   [facility] Outpatient [address] [place], [place]   ADS: Alcohol & Drug S[facility]cs [address]  Dr, [place], [place]   [place] [address],  [place], [place] or [phone]   Substance Abuse Resources Organization         Address  Phone  Notes  Alcohol and Drug Services  [phone]   [place]  [phone]   [facility] [place]   Chinita Pester  [phone]   Residential & Outpatient Substance Abuse Program  [phone]   Psychological Services Organization         Address  Phone  Notes  [facility] [place]  [place]  [phone]   [place] [address] [place] or [phone]    Mobile Crisis Teams Organization         Address  Phone  Notes  [facility], Mobile Crisis Care Unit  [phone]   [facility]  [address] [place], [place]   [place] [address] [place] [phone]    Self-Help/Support Groups Organization         Address  Phone             Notes  [place]. of [place] - variety of support groups  [place] Call for more information  Narcotics Anonymous (NA), [facility] [address], [employer] [place]  2 meetings at this location   Special educational needs teacher         Address  Phone  Notes  [facility] [place],    [place]  [phone]-([phone]   [facility]  [address] [place] [address]1856, [place], [place]   [place], [place] [phone] Admissions: 8am-3pm M-F  Incentives Substance [place] [address],    [place], [place] [phone]   [facility] [address] [place], [place], [place]   [facility] [address]  [place], [place]   [facility] - Intensive Outpatient [address] Kristeen [address], [place], [place]   [facility] [place].,  [place], [place] [phone] or [phone]   [facility] Treatment Services (RTS) [address] [place], [place] Accepts Medicaid  [facility] [place] [address],  [place] [phone] Substance Abuse/Addiction Treatment   [facility] Organization         Address  Phone  Notes  [facility]  [phone]   Domenic Schwab, PhD [address] Arlis Porta [place], [place]   [phone] or [phone]   [place] Cleary [place], [place] [phone]   [facility] [address] [place], [place] [phone] Insurance/Medicaid/sponsorship through [facility] Families [address]                                    [place], [place] [phone] [place] [address], [place] [phone]    Dr. Adele Schilder  [phone]   [facility] [place] Dept. 1) [address], [place] 2) [place] 3)  [place], [place] [phone]  [phone]   [place] [phone])  [phone] or [phone] (After Hours)

## 2014-09-29 NOTE — ED Notes (Signed)
Pt reports lower abd pain that started this morning. Pt reports the pain is intense and causing her to feel SOB.

## 2014-09-29 NOTE — ED Provider Notes (Signed)
CSN: [id number]     Arrival date & time 09/29/14  1139 History   None    Chief Complaint  Patient presents with  . Abdominal Pain     (Consider location/radiation/quality/duration/timing/severity/associated sxs/prior Treatment) HPI  Alexandria Ellis is a 24 y.o. female complaining of lower abdominal pain consistent with prior premenstrual cramping onset this morning, patient's menses also commence today. States that this pain is more severe than her typical menstrual cramps, it is rated at 7 out of 10 at worst. Patient had 3 200 mg ibuprofen 11:30 AM this morning with significant relief of pain, it is rated at 4 out of 10 at this moment. Patient states that when the pain was very severe she had a momentary shortness of breath that she thinks was related to the severity of the pain. She has no continued shortness of breath, no chest pain, no syncope. Patient denies any abnormal vaginal discharge, dysuria, hematuria. She does not have OB/GYN care.  History reviewed. No pertinent past medical history. History reviewed. No pertinent past surgical history. History reviewed. No pertinent family history. History  Substance Use Topics  . Smoking status: Never Smoker   . Smokeless tobacco: Not on file  . Alcohol Use: Yes     Comment: socially   OB History    No data available     Review of Systems  10 systems reviewed and found to be negative, except as noted in the HPI.   Allergies  Review of patient's allergies indicates no known allergies.  Home Medications   Prior to Admission medications   Medication Sig Start Date End Date Taking? Authorizing Provider  metroNIDAZOLE (FLAGYL) 500 MG tablet Take 1 tablet (500 mg total) by mouth 2 (two) times daily. 08/08/13   Arman Filter, NP   BP 118/70 mmHg  Pulse 70  Temp(Src) 97.4 F (36.3 C) (Oral)  Resp 16  SpO2 100%  LMP 09/29/2014 Physical Exam  Constitutional: She is oriented to person, place, and time. She appears well-developed  and well-nourished. No distress.  HENT:  Head: Normocephalic.  Mouth/Throat: Oropharynx is clear and moist.  Eyes: Conjunctivae and EOM are normal. Pupils are equal, round, and reactive to light.  Neck: Normal range of motion.  Cardiovascular: Normal rate, regular rhythm, normal heart sounds and intact distal pulses.   Pulmonary/Chest: Effort normal and breath sounds normal. No stridor. No respiratory distress. She has no wheezes. She has no rales. She exhibits no tenderness.  Abdominal: Soft. Bowel sounds are normal. She exhibits no distension and no mass. There is no tenderness. There is no rebound and no guarding.  Musculoskeletal: Normal range of motion. She exhibits no edema.  Neurological: She is alert and oriented to person, place, and time.  Skin: Skin is warm.  Psychiatric: She has a normal mood and affect.  Nursing note and vitals reviewed.   ED Course  Procedures (including critical care time) Labs Review Labs Reviewed  CBC WITH DIFFERENTIAL - Abnormal; Notable for the following:    Neutrophils Relative % 84 (*)    All other components within normal limits  COMPREHENSIVE METABOLIC PANEL - Abnormal; Notable for the following:    Glucose, Bld 100 (*)    All other components within normal limits  LIPASE, BLOOD  URINALYSIS, ROUTINE W REFLEX MICROSCOPIC  I-STAT BETA HCG BLOOD, ED ([facility], [facility], [facility] ONLY)    Imaging Review No results found.   EKG Interpretation None      MDM   Final diagnoses:  Severe  menstrual cramps    Filed Vitals:   09/29/14 1645 09/29/14 1700 09/29/14 1715 09/29/14 1728  BP: 117/74 119/74 109/69 111/63  Pulse: 74 74 79 74  Temp:      TempSrc:      Resp:    16  SpO2: 99% 100% 99% 99%    Medications  ketorolac (TORADOL) injection 30 mg (30 mg Intramuscular Given 09/29/14 1632)    Alexandria Ellis is a pleasant 24 y.o. female presenting with severe lower abdominal pain which is similar to her prior menstrual cramping to slightly more severe  this month. States that the pain was so severe that it took her breath away. States that the symptoms are largely resolved. Patient took Motrin at home with good relief. Patient's lung sounds are clear to auscultation, she is saturating 100% on room air.  Serial dominant exams are benign with no peritoneal signs. Blood work is reassuring. Patient given Toradol in the ED and advised to take Motrin as an outpatient. I will give her referral to OB/GYN.  Evaluation does not show pathology that would require ongoing emergent intervention or inpatient treatment. Pt is hemodynamically stable and mentating appropriately. Discussed findings and plan with patient/guardian, who agrees with care plan. All questions answered. Return precautions discussed and outpatient follow up given.        Wynetta Emery, PA-C 09/29/14 2131  Gerhard Munch, MD 09/30/14 [phone]

## 2015-08-07 ENCOUNTER — Encounter (HOSPITAL_COMMUNITY): Payer: Self-pay | Admitting: Emergency Medicine

## 2015-08-07 ENCOUNTER — Emergency Department (HOSPITAL_COMMUNITY)
Admission: EM | Admit: 2015-08-07 | Discharge: 2015-08-07 | Disposition: A | Discharge status: Home / Self Care | Payer: BLUE CROSS/BLUE SHIELD | Attending: Emergency Medicine | Admitting: Emergency Medicine

## 2015-08-07 DIAGNOSIS — S299XXA Unspecified injury of thorax, initial encounter: Secondary | ICD-10-CM | POA: Insufficient documentation

## 2015-08-07 DIAGNOSIS — Y998 Other external cause status: Secondary | ICD-10-CM | POA: Diagnosis not present

## 2015-08-07 DIAGNOSIS — M549 Dorsalgia, unspecified: Secondary | ICD-10-CM

## 2015-08-07 DIAGNOSIS — Y92481 Parking lot as the place of occurrence of the external cause: Secondary | ICD-10-CM | POA: Insufficient documentation

## 2015-08-07 DIAGNOSIS — S199XXA Unspecified injury of neck, initial encounter: Secondary | ICD-10-CM | POA: Insufficient documentation

## 2015-08-07 DIAGNOSIS — Y9389 Activity, other specified: Secondary | ICD-10-CM | POA: Insufficient documentation

## 2015-08-07 MED ORDER — KETOROLAC TROMETHAMINE 60 MG/2ML IM SOLN
60.0000 mg | Freq: Once | INTRAMUSCULAR | Status: AC
  Administered 2015-08-07: 60 mg via INTRAMUSCULAR
  Filled 2015-08-07: qty 2

## 2015-08-07 MED ORDER — METHOCARBAMOL 500 MG PO TABS: 500.0000 mg | ORAL_TABLET | Freq: Two times a day (BID) | ORAL | Status: DC

## 2015-08-07 MED ORDER — METHOCARBAMOL 500 MG PO TABS
750.0000 mg | ORAL_TABLET | Freq: Once | ORAL | Status: AC
  Administered 2015-08-07: 750 mg via ORAL
  Filled 2015-08-07: qty 2

## 2015-08-07 MED ORDER — HYDROCODONE-ACETAMINOPHEN 5-325 MG PO TABS: 1.0000 | ORAL_TABLET | Freq: Four times a day (QID) | ORAL | Status: DC | PRN

## 2015-08-07 NOTE — ED Notes (Signed)
Patient left at this time with all belongings. 
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
## 2015-08-07 NOTE — Discharge Instructions (Signed)
As we discussed, you might feel that your pain gets worse before it gets better over the next few days. Take the muscle relaxant, Robaxin, as prescribed. You may also take an NSAID such as ibuprofen or naproxen to help with the pain. Heat to the affected areas will help relax the muscles as well. Return to the ER for new or worsening symptoms.  Take medications as prescribed. Return to the emergency room for worsening condition or new concerning symptoms. Follow up with your regular doctor. If you don't have a regular doctor use one of the numbers below to establish a primary care doctor.   Emergency Department Resource Guide 1) Find a Doctor and Pay Out of Pocket Although you won't have to find out who is covered by your insurance plan, it is a good idea to ask around and get recommendations. You will then need to call the office and see if the doctor you have chosen will accept you as a new patient and what types of options they offer for patients who are self-pay. Some doctors offer discounts or will set up payment plans for their patients who do not have insurance, but you will need to ask so you aren't surprised when you get to your appointment.  2) Contact Your Local Health Department Not all health departments have doctors that can see patients for sick visits, but many do, so it is worth a call to see if yours does. If you don't know where your local health department is, you can check in your phone book. The CDC also has a tool to help you locate your state's health department, and many state websites also have listings of all of their local health departments.  3) Find a Walk-in Clinic If your illness is not likely to be very severe or complicated, you may want to try a walk in clinic. These are popping up all over the country in pharmacies, drugstores, and shopping centers. They're usually staffed by nurse practitioners or physician assistants that have been trained to treat common illnesses  and complaints. They're usually fairly quick and inexpensive. However, if you have serious medical issues or chronic medical problems, these are probably not your best option.  No Primary Care Doctor: - Call Health Connect at  [phone] - they can help you locate a primary care doctor that  accepts your insurance, provides certain services, etc. - Physician Referral [phone]  Emergency Department Resource Guide 1) Find a Doctor and Pay Out of Pocket Although you won't have to find out who is covered by your insurance plan, it is a good idea to ask around and get recommendations. You will then need to call the office and see if the doctor you have chosen will accept you as a new patient and what types of options they offer for patients who are self-pay. Some doctors offer discounts or will set up payment plans for their patients who do not have insurance, but you will need to ask so you aren't surprised when you get to your appointment.  2) Contact Your Local Health Department Not all health departments have doctors that can see patients for sick visits, but many do, so it is worth a call to see if yours does. If you don't know where your local health department is, you can check in your phone book. The CDC also has a tool to help you locate your state's health department, and many state websites also have listings of all of their local  health departments.  3) Find a [facility] If your illness is not likely to be very severe or complicated, you may want to try a walk in clinic. These are popping up all over the country in pharmacies, drugstores, and shopping centers. They're usually staffed by nurse practitioners or physician assistants that have been trained to treat common illnesses and complaints. They're usually fairly quick and inexpensive. However, if you have serious medical issues or chronic medical problems, these are probably not your best option.  No Primary Care  Doctor: - Call Health Connect at  [phone] - they can help you locate a primary care doctor that  accepts your insurance, provides certain services, etc. - Physician Referral Service- [phone]  Chronic Pain Problems: Organization         Address  Phone   Notes  [facility]  [phone] Patients need to be referred by their primary care doctor.   Medication Assistance: Organization         Address  Phone   Notes  [facility] Medication [facility] [address] [place], [place] [address] [phone] --Must be a resident of [facility] -- Must have NO insurance coverage whatsoever (no Medicaid/ Medicare, etc.) -- The pt. MUST have a primary care doctor that directs their care regularly and follows them in the community   [facility]  [phone]   [employer]  [phone]    Agencies that provide inexpensive medical care: Organization         Address  Phone   Notes  [place]  [phone]   [facility]    [phone]   [facility] [place], [place] [address] [phone]   [place] [address], [place] [phone]   [facility]    [phone]   [facility]    [phone]   [place] [address], [place] Phone:  [phone], Fax:  [phone] Hours of Operation:  9 am - 6 pm, M-F.  Also accepts Medicaid/Medicare and self-pay.  [facility] [place] [address], [address], [place] Phone: [phone], Fax: [phone]. Hours of Operation:  8:30 am - 5:30 pm, M-F.  Also accepts Medicaid and self-pay.  [facility] [place] [address], [place] Phone: [phone]   [place], [place], [place] [phone], Ext. [phone] Mondays & Thursdays: 7-9 AM.  First 15 patients are seen on a first  come, first serve basis.    [place] Providers:  Organization         Address  Phone   Notes  [facility] [address], [place] [phone] Also accepts self-pay patients.  [facility] [id number] [place], [place]  [phone]   [place], [address], [place] [phone]   [facility] [address], [place] [phone]   Lucianne Lei [address], [place]   [phone] Only accepts [place] patients after they have their name applied to their card.   Self-Pay (no insurance) in [facility]:  Organization         Address  Phone   Notes  Sickle Cell Patients, [employer]  [facility] [address] [place], [place] [phone]   [facility] [address] [place], [place] [phone]   [facility]  [address] [place] [address], [place] [phone]   [facility]/Dr. Osei-Bonsu  [address] [place] or [address], [place] [phone] Phone number for both [place] and [place] locations is the same.  [facility] [address] [place] [phone]   [facility] [address] [place] or [address] [phone]   [facility] [address] [place] [phone], phone; [phone], fax Sees patients 1st and 3rd Saturday of every month.  Must not qualify for public or private insurance (i.e. Medicaid, Medicare, [place] Health Choice, Veterans' Benefits)  Household income should be no more than 200% of the poverty level The clinic cannot treat you if you are pregnant or think you are pregnant  Sexually transmitted diseases are not treated at the clinic.    Please obtain all of your results from medical  records or have your doctors office obtain the results - share them with your doctor - you should be seen at your doctors office in the next 2 days. Call today to arrange your follow up. Take the medications as prescribed. Please review all of the medicines and only take them if you do not have an allergy to them. Please be aware that if you are taking birth control pills, taking other prescriptions, ESPECIALLY ANTIBIOTICS may make the birth control ineffective - if this is the case, either do not engage in sexual activity or use alternative methods of birth control such as condoms until you have finished the medicine and your family doctor says it is OK to restart them. If you are on a blood thinner such as COUMADIN, be aware that any other medicine that you take may cause the coumadin to either work too much, or not enough - you should have your coumadin level rechecked in next 7 days if this is the case.  ?  It is also a possibility that you have an allergic reaction to any of the medicines that you have been prescribed - Everybody reacts differently to medications and while MOST people have no trouble with most medicines, you may have a reaction such as nausea, vomiting, rash, swelling, shortness of breath. If this is the case, please stop taking the medicine immediately and contact your physician.  ?  You should return to the ER if you develop severe or worsening symptoms.
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
## 2016-07-05 ENCOUNTER — Ambulatory Visit (HOSPITAL_COMMUNITY)
Admission: EM | Admit: 2016-07-05 | Discharge: 2016-07-05 | Disposition: A | Discharge status: Home / Self Care | Payer: BLUE CROSS/BLUE SHIELD | Attending: Emergency Medicine | Admitting: Emergency Medicine

## 2016-07-05 ENCOUNTER — Encounter (HOSPITAL_COMMUNITY): Payer: Self-pay | Admitting: Emergency Medicine

## 2016-07-05 DIAGNOSIS — N3000 Acute cystitis without hematuria: Secondary | ICD-10-CM | POA: Diagnosis not present

## 2016-07-05 LAB — POCT URINALYSIS DIP (DEVICE)
Bilirubin Urine: NEGATIVE
Glucose, UA: NEGATIVE mg/dL
Ketones, ur: NEGATIVE mg/dL
NITRITE: POSITIVE — AB
PH: 7 (ref 5.0–8.0)
Protein, ur: NEGATIVE mg/dL
Specific Gravity, Urine: 1.02 (ref 1.005–1.030)
Urobilinogen, UA: 2 mg/dL — ABNORMAL HIGH (ref 0.0–1.0)

## 2016-07-05 MED ORDER — CEPHALEXIN 500 MG PO CAPS: 500.0000 mg | ORAL_CAPSULE | Freq: Two times a day (BID) | ORAL | 0 refills | Status: DC

## 2016-07-05 NOTE — Discharge Instructions (Signed)
You may try over the counter medication called Azo to help with bladder spasms.  This medication can make your urine orange, which is normal.   Emergency Department Resource Guide 1) Find a Doctor and Pay Out of Pocket Although you won't have to find out who is covered by your insurance plan, it is a good idea to ask around and get recommendations. You will then need to call the office and see if the doctor you have chosen will accept you as a new patient and what types of options they offer for patients who are self-pay. Some doctors offer discounts or will set up payment plans for their patients who do not have insurance, but you will need to ask so you aren't surprised when you get to your appointment.  2) Contact Your Local Health Department Not all health departments have doctors that can see patients for sick visits, but many do, so it is worth a call to see if yours does. If you don't know where your local health department is, you can check in your phone book. The CDC also has a tool to help you locate your state's health department, and many state websites also have listings of all of their local health departments.  3) Find a Walk-in Clinic If your illness is not likely to be very severe or complicated, you may want to try a walk in clinic. These are popping up all over the country in pharmacies, drugstores, and shopping centers. They're usually staffed by nurse practitioners or physician assistants that have been trained to treat common illnesses and complaints. They're usually fairly quick and inexpensive. However, if you have serious medical issues or chronic medical problems, these are probably not your best option.  No Primary Care Doctor: - Call Health Connect at  [phone] - they can help you locate a primary care doctor that  accepts your insurance, provides certain services, etc. - Physician Referral Service- [phone]  Chronic Pain Problems: Organization          Address  Phone   Notes  [facility]  [phone] Patients need to be referred by their primary care doctor.   Medication Assistance: Organization         Address  Phone   Notes  [facility] Medication [facility] [address] [place] [place], [place] [address] [phone] --Must be a resident of [facility] -- Must have NO insurance coverage whatsoever (no Medicaid/ Medicare, etc.) -- The pt. MUST have a primary care doctor that directs their care regularly and follows them in the community   [facility]  [phone]   [employer]  [phone]    Agencies that provide inexpensive medical care: Buyer, retail  Notes  [facility]  [phone]   [facility]    [phone]   [facility] [address] [place], [place] [address] [phone]) [phone]   [facility] [place] [address], [place] [phone]   Planned Parenthood    [phone]   [facility]    [phone]   [facility]  [address], [place] Phone:  [phone], Fax:  [phone]) [phone] Hours of Operation:  9 am - 6 pm, M-F.  Also accepts Medicaid/Medicare and self-pay.  [facility]  [address], [place] Phone: [phone], Fax: [phone]. Hours of Operation:  8:30 am - 5:30 pm, M-F.  Also accepts Medicaid and self-pay.  [facility] [place] [address], [place] Phone: [phone]) [phone]   [facility] [address], [place], [place] [phone], Ext. [phone] Mondays & Thursdays: 7-9 AM.  First 15 patients are seen on a first come, first serve basis.    Medicaid-accepting [facility] Providers:  Organization         Address                                                                        Phone                               Notes  [facility] [address], [place] [phone]) [phone] Also accepts self-pay patients.  [facility] [address], [place]  [phone]   [facility] [address], [place] [phone](336) [phone]   [facility] [address] [place] [address], [place] [phone](336) [phone]   Renaye RakersVeita Bland [address], [place]   [phone] Only accepts [place] Access [place]icaid patients after they have their name applied to their card.   Self-Pay (no insurance) in [facility]:   Organization         Address                                                     Phone               Notes  Sickle Cell Patients, [facility] [address], [place] [phone]) [phone]   [facility] [address], [place] [phone]   [facility] [place]  [address], [place] [phone]   [facility]/Dr. Osei-Bonsu  [address] [place] [address], [place] or [address], [place] [phone] Phone number for both [place] and [place] locations is the same.  [facility] [address], [place] [phone]) [phone]   [facility] [place] [address] [place] [address], [place] or [address] [phone]   [facility] [address], [place] [phone]) [phone], phone; [phone]) [phone], fax Sees patients 1st and 3rd Saturday of every month.  Must not qualify for public or private insurance (i.e. Medicaid, Medicare, [place] Health Choice, Veterans' Benefits)  Household income should be no more than 200% of the poverty level The clinic cannot treat you if you are pregnant or think you are pregnant  Sexually transmitted diseases are not treated at the clinic.    Dental  Care: Organization         Address                                  Phone                       Notes  [facility] [address], [place] [phone] Accepts children up to age 24 who are enrolled in [place]icaid or [place] Health Choice; pregnant women with a Medicaid card; and children who have applied for Medicaid or [place] Health Choice, but were declined, whose parents can pay a reduced fee at time of service.  [facility] [place]  [address], [place] [phone] Accepts children up to age 24 who are enrolled in [place]icaid or [place] Health Choice; pregnant women with a Medicaid card; and children who have applied for Medicaid or [place] Health Choice, but were declined, whose parents can pay a reduced fee at time of service.  [facility]  [address], [place] [phone]) [phone] Patients are seen by appointment only. Walk-ins are not accepted. [facility] will see patients 24 years of age and older. Monday - Tuesday (8am-5pm) Most Wednesdays (8:30-5pm) $30 per visit, cash only  [facility]  [address], [facility] [phone]) [phone] Patients are seen by appointment only. Walk-ins are not accepted. [facility] will see patients 24 years of age and older. One Wednesday Evening (Monthly: Volunteer Based).  $30 per visit, cash only  [facility]  [phone] for adults; Children under age 584, call Graduate Pediatric Dentistry at [phone]. Children aged 254-14, please call [phone] to request a pediatric application.  Dental services are provided in all areas of dental care including fillings, crowns and bridges, complete and partial dentures, implants, gum treatment, root canals, and extractions. Preventive care is also provided. Treatment is provided to both adults and children. Patients are selected via a  lottery and there is often a waiting list.   [facility] [address], [place]  [phone] [email or web address]   [facility] [address], [place], [place] [phone], Ext. [phone] Second and Fourth Thursday of each month, opens at 6:30 AM; Clinic ends at 9 AM.  Patients are seen on a first-come first-served basis, and a limited number are seen during each clinic.   [facility]  [address], [place], [place] [phone]   Eligibility Requirements You must have lived in [place], [place], or [place] counties for at least the last three months.   You cannot be eligible for state or federal sponsored National Cityhealthcare insurance, including CIGNAVeterans Administration, [place]icaid, or [employer] EntertainmentMedicare.   You generally cannot be eligible for healthcare insurance through your employer.    How to apply: Eligibility screenings are held every  Tuesday and Wednesday afternoon from 1:00 pm until 4:00 pm. You do not need an appointment for the interview!  [facility] [address], [place], [place] [phone]   [facility]  [phone]   [facility]  [phone]   [facility]  [phone]    Behavioral Health Resources in the Community: Intensive Outpatient Programs Organization         Address                                              Phone              Notes  [facility] [address], [place], [place] [phone]   [facility] [address], [place], [place] [phone]   [facility] [address], [place], [place]  [phone]   [facility] [address],  [place], [place] [phone] or [phone]   Substance Abuse Resources Organization         Address                                Phone  Notes  [facility]  [phone]   [facility]   [phone]   [facility]  [phone]   [facility]  [phone]   Residential & Outpatient Substance Abuse Program  [phone]   Psychological Services Organization         Address                                  Phone                Notes  [facility]  [phone]- [phone]   [facility]  [phone]- [phone]   [facility] [address], [place] [phone] or [phone]    Mobile Crisis Teams Organization         Address  Phone  Notes  [facility], Mobile Crisis Care Unit  [phone]   [facility]  [address] [place], [place] [phone]   Doristine LocksSharon DeEsch [address] [place] [phone]    Self-Help/Support Groups Organization         Address                         Phone             Notes  [facility] of [place] - variety of support groups  336- [phone] Call for more information  Narcotics Anonymous (NA), [facility] [address], [place]  2 meetings at this location   Statisticianesidential Treatment Programs Organization         Address                                                    Phone              Notes  [facility] [phone]  [address],    [place]  [phone]   [facility]  [address], [place] [phone], [place], [place] [phone]   [facility] [address], [place] [phone] Admissions: 8am-3pm M-F  [facility] [address]    [place], [place] [phone]   [facility] [address], [place], [place] [phone]   [facility] [address]  [place], [place] [phone]   [facility] - Intensive Outpatient [address], [place], [place] [phone]   [facility] ([facility] [address]  [place], [place] [phone] or [phone]   [facility] ([facility]) [address] [place], [place] [phone] Accepts Medicaid  [facility] [address]  [place] [phone] Substance Abuse/Addiction Treatment   [facility] Resources Organization         Address                                                            Phone                    Notes  [facility]  [phone]   Angie FavaJulie Brannon, PhD [address], [address] [place], [place]   [phone](336) [phone] or [phone]   [facility]   [address] [place], [place] [phone]) [phone]   [facility] [address], [place], [place] [phone]) [phone] Insurance/Medicaid/sponsorship through [facility]  Faith and Families [address]                                    [place], [place] [phone]) [phone] Therapy/tele-psych/case  [facility] [address].   [place], [place] [phone]) [phone]    Dr. Lolly MustacheArfeen  [phone]) [phone]   [facility] [place]  [employer] [facility] 1) [address], [place] 2) [address], [place] 3)  [address], [place] [phone]  [phone]) [phone]   [facility] Child Abuse Hotline [phone]) [phone] or [phone](336) [phone] (After Hours)

## 2016-07-05 NOTE — ED Triage Notes (Signed)
[facility] a [facility] and urinary frequency and urgency x 2 days.

## 2016-07-05 NOTE — ED Provider Notes (Signed)
CSN: [id number]     Arrival date & time 07/05/16  1616 History   First MD Initiated Contact with Patient 07/05/16 1707     Chief Complaint  Patient presents with  . Dysuria   (Consider location/radiation/quality/duration/timing/severity/associated sxs/prior Treatment) HPI  Caswell CorwinKeshanna Panameno is a 25 y.o. female presenting to UC with c/o 2 days of gradually worsening dysuria and urgency for about 2 days.  Intermittent lower abdominal cramping that is 7/10.  Symptoms similar to prior UTI several years ago.  She has not tried anything for symptoms.  Denies fever, chills, n/v/d. Denies vaginal symptoms.    History reviewed. No pertinent past medical history. History reviewed. No pertinent surgical history. History reviewed. No pertinent family history. Social History  Substance Use Topics  . Smoking status: Never Smoker  . Smokeless tobacco: Never Used  . Alcohol use Yes     Comment: socially   OB History    No data available     Review of Systems  Constitutional: Negative for chills, fatigue and fever.  Gastrointestinal: Positive for abdominal pain (lower abdominal cramping). Negative for diarrhea, nausea and vomiting.  Genitourinary: Positive for dysuria and urgency. Negative for decreased urine volume, flank pain, frequency, hematuria, pelvic pain, vaginal bleeding, vaginal discharge and vaginal pain.  Musculoskeletal: Negative for back pain and myalgias.    Allergies  Review of patient's allergies indicates no known allergies.  Home Medications   Prior to Admission medications   Medication Sig Start Date End Date Taking? Authorizing Provider  cephALEXin (KEFLEX) 500 MG capsule Take 1 capsule (500 mg total) by mouth 2 (two) times daily. For 7 days 07/05/16   Junius FinnerErin O'Malley, PA-C  HYDROcodone-acetaminophen (NORCO/VICODIN) 5-325 MG tablet Take 1 tablet by mouth every 6 (six) hours as needed. 08/07/15   Ace GinsSerena Y Sam, PA-C  methocarbamol (ROBAXIN) 500 MG tablet Take 1 tablet (500  mg total) by mouth 2 (two) times daily. 08/07/15   Ace GinsSerena Y Sam, PA-C  metroNIDAZOLE (FLAGYL) 500 MG tablet Take 1 tablet (500 mg total) by mouth 2 (two) times daily. Patient not taking: Reported on 09/29/2014 08/08/13   Earley FavorGail Schulz, NP   Meds Ordered and Administered this Visit  Medications - No data to display  BP 112/73 (BP Location: Left Arm)   Pulse 74   Temp 98.6 F (37 C) (Oral)   Resp 16   LMP 06/19/2016 (Exact Date)   SpO2 100%  No data found.   Physical Exam  Constitutional: She is oriented to person, place, and time. She appears well-developed and well-nourished. No distress.  HENT:  Head: Normocephalic and atraumatic.  Mouth/Throat: Oropharynx is clear and moist.  Eyes: EOM are normal.  Neck: Normal range of motion.  Cardiovascular: Normal rate and regular rhythm.   Pulmonary/Chest: Effort normal and breath sounds normal. No respiratory distress. She has no wheezes. She has no rales.  Abdominal: Soft. Bowel sounds are normal. She exhibits no distension and no mass. There is no tenderness. There is no rebound, no guarding and no CVA tenderness.  Musculoskeletal: Normal range of motion.  Neurological: She is alert and oriented to person, place, and time.  Skin: Skin is warm and dry. She is not diaphoretic.  Psychiatric: She has a normal mood and affect. Her behavior is normal.  Nursing note and vitals reviewed.   Urgent Care Course   Clinical Course    Procedures (including critical care time)  Labs Review Labs Reviewed  POCT URINALYSIS DIP (DEVICE) - Abnormal; Notable for the following:  Result Value   Hgb urine dipstick TRACE (*)    Urobilinogen, UA 2.0 (*)    Nitrite POSITIVE (*)    Leukocytes, UA SMALL (*)    All other components within normal limits    Imaging Review No results found.  MDM   1. Acute cystitis without hematuria    Pt c/o urinary symptoms c/w prior UTIs.  Pt is afebrile. Appears well hydrated. No CVA tenderness.  UA c/w  UTI  Rx: Keflex  Encouraged to stay well hydrated. May take OTC Azo to help with urinary symptoms. F/u in 1 week if not improving, sooner if worsening.    Junius Finner, PA-C 07/05/16 1740

## 2016-11-15 ENCOUNTER — Emergency Department (HOSPITAL_COMMUNITY): Payer: BLUE CROSS/BLUE SHIELD

## 2016-11-15 ENCOUNTER — Emergency Department (HOSPITAL_COMMUNITY)
Admission: EM | Admit: 2016-11-15 | Discharge: 2016-11-15 | Disposition: A | Discharge status: Home / Self Care | Payer: BLUE CROSS/BLUE SHIELD | Attending: Emergency Medicine | Admitting: Emergency Medicine

## 2016-11-15 ENCOUNTER — Encounter (HOSPITAL_COMMUNITY): Payer: Self-pay | Admitting: *Deleted

## 2016-11-15 DIAGNOSIS — Y999 Unspecified external cause status: Secondary | ICD-10-CM | POA: Diagnosis not present

## 2016-11-15 DIAGNOSIS — S63690A Other sprain of right index finger, initial encounter: Secondary | ICD-10-CM | POA: Diagnosis not present

## 2016-11-15 DIAGNOSIS — Y9389 Activity, other specified: Secondary | ICD-10-CM | POA: Insufficient documentation

## 2016-11-15 DIAGNOSIS — S6991XA Unspecified injury of right wrist, hand and finger(s), initial encounter: Secondary | ICD-10-CM | POA: Diagnosis present

## 2016-11-15 DIAGNOSIS — Y929 Unspecified place or not applicable: Secondary | ICD-10-CM | POA: Insufficient documentation

## 2016-11-15 MED ORDER — IBUPROFEN 200 MG PO TABS
600.0000 mg | ORAL_TABLET | Freq: Once | ORAL | Status: AC
  Administered 2016-11-15: 600 mg via ORAL
  Filled 2016-11-15: qty 1

## 2016-11-15 MED ORDER — DICLOFENAC SODIUM 50 MG PO TBEC: 50.0000 mg | DELAYED_RELEASE_TABLET | Freq: Two times a day (BID) | ORAL | 0 refills | Status: DC

## 2016-11-15 NOTE — ED Triage Notes (Signed)
Pt reports injurying right pointer finger yesterday, swelling noted.

## 2016-11-15 NOTE — Progress Notes (Signed)
Orthopedic Tech Progress Note Patient Details:  Alexandria CorwinKeshanna Ellis December 03, 1990 [id number]  Ortho Devices Type of Ortho Device: Finger splint Ortho Device/Splint Location: RUE index finger Ortho Device/Splint Interventions: Ordered, Application   Alexandria MoccasinHughes, Alexandria Ellis 11/15/2016, 9:25 PM

## 2016-11-15 NOTE — Discharge Instructions (Signed)
Wear the splint for comfort and take the medication as directed. Follow up with your doctor or return here as needed.

## 2016-11-15 NOTE — ED Notes (Signed)
See EDP assessment 
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
## 2017-02-28 ENCOUNTER — Emergency Department (HOSPITAL_COMMUNITY)
Admission: EM | Admit: 2017-02-28 | Discharge: 2017-02-28 | Disposition: A | Discharge status: Home / Self Care | Payer: BLUE CROSS/BLUE SHIELD | Attending: Emergency Medicine | Admitting: Emergency Medicine

## 2017-02-28 ENCOUNTER — Encounter (HOSPITAL_COMMUNITY): Payer: Self-pay | Admitting: Emergency Medicine

## 2017-02-28 DIAGNOSIS — Z79899 Other long term (current) drug therapy: Secondary | ICD-10-CM | POA: Insufficient documentation

## 2017-02-28 DIAGNOSIS — R112 Nausea with vomiting, unspecified: Secondary | ICD-10-CM | POA: Diagnosis present

## 2017-02-28 DIAGNOSIS — R197 Diarrhea, unspecified: Secondary | ICD-10-CM | POA: Diagnosis not present

## 2017-02-28 LAB — CBC
HCT: 43.2 % (ref 36.0–46.0)
HEMOGLOBIN: 14.2 g/dL (ref 12.0–15.0)
MCH: 29.8 pg (ref 26.0–34.0)
MCHC: 32.9 g/dL (ref 30.0–36.0)
MCV: 90.8 fL (ref 78.0–100.0)
Platelets: 280 10*3/uL (ref 150–400)
RBC: 4.76 MIL/uL (ref 3.87–5.11)
RDW: 13.2 % (ref 11.5–15.5)
WBC: 6.3 10*3/uL (ref 4.0–10.5)

## 2017-02-28 LAB — URINALYSIS, ROUTINE W REFLEX MICROSCOPIC
Bilirubin Urine: NEGATIVE
Glucose, UA: NEGATIVE mg/dL
KETONES UR: NEGATIVE mg/dL
Leukocytes, UA: NEGATIVE
Nitrite: NEGATIVE
Protein, ur: NEGATIVE mg/dL
Specific Gravity, Urine: 1.014 (ref 1.005–1.030)
pH: 5 (ref 5.0–8.0)

## 2017-02-28 LAB — COMPREHENSIVE METABOLIC PANEL
ALT: 13 U/L — AB (ref 14–54)
ANION GAP: 8 (ref 5–15)
AST: 13 U/L — ABNORMAL LOW (ref 15–41)
Albumin: 4.1 g/dL (ref 3.5–5.0)
Alkaline Phosphatase: 45 U/L (ref 38–126)
BUN: 8 mg/dL (ref 6–20)
CO2: 24 mmol/L (ref 22–32)
CREATININE: 0.94 mg/dL (ref 0.44–1.00)
Calcium: 9.2 mg/dL (ref 8.9–10.3)
Chloride: 102 mmol/L (ref 101–111)
GFR calc non Af Amer: >60 mL/min (ref >60)
Glucose, Bld: 90 mg/dL (ref 65–99)
Potassium: 3.5 mmol/L (ref 3.5–5.1)
SODIUM: 134 mmol/L — AB (ref 135–145)
Total Bilirubin: 0.9 mg/dL (ref 0.3–1.2)
Total Protein: 7.5 g/dL (ref 6.5–8.1)

## 2017-02-28 LAB — LIPASE, BLOOD: Lipase: 15 U/L (ref 11–51)

## 2017-02-28 LAB — I-STAT BETA HCG BLOOD, ED (MC, WL, AP ONLY)

## 2017-02-28 MED ORDER — ONDANSETRON 4 MG PO TBDP
4.0000 mg | ORAL_TABLET | Freq: Once | ORAL | Status: AC
  Administered 2017-02-28: 4 mg via ORAL

## 2017-02-28 MED ORDER — SODIUM CHLORIDE 0.9 % IV BOLUS (SEPSIS)
1000.0000 mL | Freq: Once | INTRAVENOUS | Status: AC
  Administered 2017-02-28: 1000 mL via INTRAVENOUS

## 2017-02-28 MED ORDER — ACETAMINOPHEN 325 MG PO TABS
ORAL_TABLET | ORAL | Status: AC
  Filled 2017-02-28: qty 2

## 2017-02-28 MED ORDER — ONDANSETRON 4 MG PO TBDP
ORAL_TABLET | ORAL | Status: AC
  Filled 2017-02-28: qty 1

## 2017-02-28 MED ORDER — METOCLOPRAMIDE HCL 5 MG/ML IJ SOLN
10.0000 mg | Freq: Once | INTRAMUSCULAR | Status: AC
  Administered 2017-02-28: 10 mg via INTRAVENOUS
  Filled 2017-02-28: qty 2

## 2017-02-28 MED ORDER — LOPERAMIDE HCL 2 MG PO CAPS: 2.0000 mg | ORAL_CAPSULE | Freq: Four times a day (QID) | ORAL | 0 refills | Status: DC | PRN

## 2017-02-28 MED ORDER — MORPHINE SULFATE (PF) 4 MG/ML IV SOLN
4.0000 mg | Freq: Once | INTRAVENOUS | Status: AC
  Administered 2017-02-28: 4 mg via INTRAVENOUS
  Filled 2017-02-28: qty 1

## 2017-02-28 MED ORDER — ONDANSETRON 4 MG PO TBDP: 4.0000 mg | ORAL_TABLET | Freq: Three times a day (TID) | ORAL | 0 refills | Status: DC | PRN

## 2017-02-28 MED ORDER — ACETAMINOPHEN 325 MG PO TABS
650.0000 mg | ORAL_TABLET | Freq: Once | ORAL | Status: AC
  Administered 2017-02-28: 650 mg via ORAL

## 2017-02-28 NOTE — ED Triage Notes (Signed)
Pt st's she woke up this am with nausea, vomiting and diarrhea.  Pt c/o hurting all over

## 2017-02-28 NOTE — ED Notes (Signed)
Pt passed fluid challenge able to keep fluids down

## 2017-02-28 NOTE — ED Provider Notes (Signed)
[facility] Provider Note   CSN: [id number] Arrival date & time: 02/28/17  1625     History   Chief Complaint Chief Complaint  Patient presents with  . Emesis  . Diarrhea    HPI Alexandria Ellis is a 26 y.o. female.  HPI   Patient is a 26 year old female with no pertinent past medical history presents the ED with complaint of nausea, vomiting and diarrhea, onset this morning. Patient reports feeling bad when she woke up this morning. She reports having multiple episodes of NBNB vomiting and nonbloody diarrhea. Endorses associated chills and generalized body aches. She also states she has been having constant diffuse abdominal pain with intermittent sharp pain to her lower abdomen. Denies any aggravating or alleviating factors. Patient denies any known sick contacts. Patient denies having any meals where she thinks she maybe got food poisoning but notes she had a [employer] sandwhich yesterday evening around 5 PM. Denies fever, headache, chest pain, shortness of breath, cough, hematemesis, urinary symptoms, blood in urine or stool, vaginal bleeding or discharge. Patient denies taking any medications prior to arrival. Denies history of abdominal surgeries. Patient denies any recent travel outside of the [place], any recent antibiotic use, drinking from fresh water sources or recent hospitalizations.  History reviewed. No pertinent past medical history.  There are no active problems to display for this patient.   History reviewed. No pertinent surgical history.  OB History    No data available       Home Medications    Prior to Admission medications   Medication Sig Start Date End Date Taking? Authorizing Provider  methocarbamol (ROBAXIN) 500 MG tablet Take 1 tablet (500 mg total) by mouth 2 (two) times daily. 08/07/15  Yes Sam, Serena Y, PA-C  cephALEXin (KEFLEX) 500 MG capsule Take 1 capsule (500 mg total) by mouth 2 (two) times daily. For 7 days Patient not taking: Reported  on 02/28/2017 07/05/16   Junius Finner'Malley, Erin, PA-C  diclofenac (VOLTAREN) 50 MG EC tablet Take 1 tablet (50 mg total) by mouth 2 (two) times daily. Patient not taking: Reported on 02/28/2017 11/15/16   Janne NapoleonNeese, Hope M, NP  HYDROcodone-acetaminophen (NORCO/VICODIN) 5-325 MG tablet Take 1 tablet by mouth every 6 (six) hours as needed. Patient not taking: Reported on 02/28/2017 08/07/15   Carlene CoriaSam, Serena Y, PA-C  loperamide (IMODIUM) 2 MG capsule Take 1 capsule (2 mg total) by mouth 4 (four) times daily as needed for diarrhea or loose stools. 02/28/17   Barrett HenleNadeau, Andrienne Havener Elizabeth, PA-C  metroNIDAZOLE (FLAGYL) 500 MG tablet Take 1 tablet (500 mg total) by mouth 2 (two) times daily. Patient not taking: Reported on 09/29/2014 08/08/13   Earley FavorSchulz, Gail, NP  ondansetron (ZOFRAN ODT) 4 MG disintegrating tablet Take 1 tablet (4 mg total) by mouth every 8 (eight) hours as needed for nausea or vomiting. 02/28/17   Barrett HenleNadeau, Tzvi Economou Elizabeth, PA-C    Family History No family history on file.  Social History Social History  Substance Use Topics  . Smoking status: Never Smoker  . Smokeless tobacco: Never Used  . Alcohol use Yes     Comment: socially     Allergies   Patient has no known allergies.   Review of Systems Review of Systems  Constitutional: Positive for chills.  Gastrointestinal: Positive for abdominal pain, diarrhea, nausea and vomiting.  Musculoskeletal: Positive for myalgias (generalized).  All other systems reviewed and are negative.    Physical Exam Updated Vital Signs BP 121/82 (BP Location: Right Arm)   Pulse  90   Temp 99 F (37.2 C) (Oral)   Resp 16   Ht 5\' 8"  (1.727 m)   Wt 80.7 kg (178 lb)   LMP 02/18/2017 (Exact Date)   SpO2 98%   BMI 27.06 kg/m   Physical Exam  Constitutional: She is oriented to person, place, and time. She appears well-developed and well-nourished. No distress.  HENT:  Head: Normocephalic and atraumatic.  Mouth/Throat: Uvula is midline and oropharynx is clear and  moist. Mucous membranes are dry. No oropharyngeal exudate, posterior oropharyngeal edema, posterior oropharyngeal erythema or tonsillar abscesses. No tonsillar exudate.  Eyes: Conjunctivae and EOM are normal. Right eye exhibits no discharge. Left eye exhibits no discharge. No scleral icterus.  Neck: Normal range of motion. Neck supple.  Cardiovascular: Normal rate, regular rhythm, normal heart sounds and intact distal pulses.   Pulmonary/Chest: Effort normal and breath sounds normal. No respiratory distress. She has no wheezes. She has no rales. She exhibits no tenderness.  Abdominal: Soft. Bowel sounds are normal. She exhibits no distension and no mass. There is tenderness. There is no rigidity, no rebound, no guarding and no CVA tenderness. No hernia.  Mild diffuse abdominal tenderness.  Musculoskeletal: Normal range of motion. She exhibits no edema.  Neurological: She is alert and oriented to person, place, and time.  Skin: Skin is warm and dry. She is not diaphoretic.  Nursing note and vitals reviewed.    ED Treatments / Results  Labs (all labs ordered are listed, but only abnormal results are displayed) Labs Reviewed  COMPREHENSIVE METABOLIC PANEL - Abnormal; Notable for the following:       Result Value   Sodium 134 (*)    AST 13 (*)    ALT 13 (*)    All other components within normal limits  URINALYSIS, ROUTINE W REFLEX MICROSCOPIC - Abnormal; Notable for the following:    APPearance HAZY (*)    Hgb urine dipstick SMALL (*)    Bacteria, UA RARE (*)    Squamous Epithelial / LPF 0-5 (*)    All other components within normal limits  LIPASE, BLOOD  CBC  I-STAT BETA HCG BLOOD, ED ([facility], [facility], [facility] ONLY)    EKG  EKG Interpretation None       Radiology No results found.  Procedures Procedures (including critical care time)  Medications Ordered in ED Medications  acetaminophen (TYLENOL) tablet 650 mg (650 mg Oral Given 02/28/17 1706)  ondansetron (ZOFRAN-ODT) disintegrating  tablet 4 mg (4 mg Oral Given 02/28/17 1706)  sodium chloride 0.9 % bolus 1,000 mL (0 mLs Intravenous Stopped 02/28/17 2022)  morphine 4 MG/ML injection 4 mg (4 mg Intravenous Given 02/28/17 1821)  metoCLOPramide (REGLAN) injection 10 mg (10 mg Intravenous Given 02/28/17 1821)  sodium chloride 0.9 % bolus 1,000 mL (0 mLs Intravenous Stopped 02/28/17 2130)     Initial Impression / Assessment and Plan / ED Course  I have reviewed the triage vital signs and the nursing notes.  Pertinent labs & imaging results that were available during my care of the patient were reviewed by me and considered in my medical decision making (see chart for details).     Patient with symptoms consistent with viral gastroenteritis.  Vitals are stable, no fever.  No signs of dehydration, tolerating PO fluids > 6 oz.  Lungs are clear.  No focal abdominal pain, no concern for appendicitis, cholecystitis, pancreatitis, ruptured viscus, UTI, kidney stone, or any other abdominal etiology. Pregnancy negative. Labs and urine unremarkable. Patient given 2  L IV fluids, pain meds and antiemetics in the ED. On reevaluation patient reports improvement of symptoms. Patient tolerating PO. Discussed results and plan for discharge with patient. Supportive therapy indicated with return if symptoms worsen.    Final Clinical Impressions(s) / ED Diagnoses   Final diagnoses:  Nausea vomiting and diarrhea    New Prescriptions New Prescriptions   LOPERAMIDE (IMODIUM) 2 MG CAPSULE    Take 1 capsule (2 mg total) by mouth 4 (four) times daily as needed for diarrhea or loose stools.   ONDANSETRON (ZOFRAN ODT) 4 MG DISINTEGRATING TABLET    Take 1 tablet (4 mg total) by mouth every 8 (eight) hours as needed for nausea or vomiting.     Barrett Henle, PA-C 02/28/17 2151    Pricilla Loveless, MD 03/02/17 [phone]

## 2017-02-28 NOTE — Discharge Instructions (Signed)
Take your medications as prescribed as needed for vomiting and diarrhea. Continue draining fluids at home to remain hydrated. Please follow up with a primary care provider from the Resource Guide provided below in 3-4 days as needed. Please return to the Emergency Department if symptoms worsen or new onset of fever, chest pain, difficult to breathing, abdominal pain, vomiting blood, unable to keep fluids down, blood in stool.

## 2017-02-28 NOTE — ED Notes (Signed)
PT states understanding of care given, follow up care, and medication prescribed. PT ambulated from ED to car with a steady gait. 

## 2017-03-30 ENCOUNTER — Emergency Department (HOSPITAL_COMMUNITY)
Admission: EM | Admit: 2017-03-30 | Discharge: 2017-03-30 | Disposition: A | Discharge status: Home / Self Care | Payer: BLUE CROSS/BLUE SHIELD | Attending: Emergency Medicine | Admitting: Emergency Medicine

## 2017-03-30 ENCOUNTER — Encounter (HOSPITAL_COMMUNITY): Payer: Self-pay

## 2017-03-30 ENCOUNTER — Emergency Department (HOSPITAL_COMMUNITY): Payer: BLUE CROSS/BLUE SHIELD

## 2017-03-30 DIAGNOSIS — Z79899 Other long term (current) drug therapy: Secondary | ICD-10-CM | POA: Diagnosis not present

## 2017-03-30 DIAGNOSIS — M25561 Pain in right knee: Secondary | ICD-10-CM | POA: Insufficient documentation

## 2017-03-30 NOTE — ED Provider Notes (Signed)
[facility] Provider Note   CSN: [id number] Arrival date & time: 03/30/17  1340  By signing my name below, I, Alexandria Ellis, attest that this documentation has been prepared under the direction and in the presence of Mohawk IndustriesJeff Odessa Morren, PA-C. Electronically Signed: Linna Darnerussell Ellis, Scribe. 03/30/2017. 4:19 PM.  History   Chief Complaint Chief Complaint  Patient presents with  . Knee Pain   The history is provided by the patient. No language interpreter was used.   HPI Comments: Alexandria Ellis is a 26 y.o. female who presents to the Emergency Department complaining of persistent right knee pain and swelling beginning three days ago. No recent trauma or injury to the knee. Her pain is worse with weight bearing but she is ambulatory. There are no alleviating factors noted. She has a h/o right patellar dislocation but states her current pain is dissimilar; she still has a knee immobilizer and crutches from when she dislocated her patella. Patient denies numbness/tingling, focal weakness, fevers, chills, bruising, wounds, nausea, vomiting, or any other associated symptoms.  History reviewed. No pertinent past medical history.  There are no active problems to display for this patient.   History reviewed. No pertinent surgical history.  OB History    No data available       Home Medications    Prior to Admission medications   Medication Sig Start Date End Date Taking? Authorizing Provider  cephALEXin (KEFLEX) 500 MG capsule Take 1 capsule (500 mg total) by mouth 2 (two) times daily. For 7 days Patient not taking: Reported on 02/28/2017 07/05/16   Lurene ShadowPhelps, Erin O, PA-C  diclofenac (VOLTAREN) 50 MG EC tablet Take 1 tablet (50 mg total) by mouth 2 (two) times daily. Patient not taking: Reported on 02/28/2017 11/15/16   Janne NapoleonNeese, Hope M, NP  HYDROcodone-acetaminophen (NORCO/VICODIN) 5-325 MG tablet Take 1 tablet by mouth every 6 (six) hours as needed. Patient not taking: Reported on 02/28/2017  08/07/15   Carlene CoriaSam, Serena Y, PA-C  loperamide (IMODIUM) 2 MG capsule Take 1 capsule (2 mg total) by mouth 4 (four) times daily as needed for diarrhea or loose stools. 02/28/17   Barrett HenleNadeau, Nicole Elizabeth, PA-C  methocarbamol (ROBAXIN) 500 MG tablet Take 1 tablet (500 mg total) by mouth 2 (two) times daily. 08/07/15   Sam, Ace GinsSerena Y, PA-C  metroNIDAZOLE (FLAGYL) 500 MG tablet Take 1 tablet (500 mg total) by mouth 2 (two) times daily. Patient not taking: Reported on 09/29/2014 08/08/13   Earley FavorSchulz, Gail, NP  ondansetron (ZOFRAN ODT) 4 MG disintegrating tablet Take 1 tablet (4 mg total) by mouth every 8 (eight) hours as needed for nausea or vomiting. 02/28/17   Barrett HenleNadeau, Nicole Elizabeth, PA-C    Family History No family history on file.  Social History Social History  Substance Use Topics  . Smoking status: Never Smoker  . Smokeless tobacco: Never Used  . Alcohol use Yes     Comment: socially     Allergies   Patient has no known allergies.   Review of Systems Review of Systems  Constitutional: Negative for chills and fever.  Gastrointestinal: Negative for nausea and vomiting.  Musculoskeletal: Positive for arthralgias and joint swelling.  Skin: Negative for color change and wound.  Neurological: Negative for weakness and numbness.  All other systems reviewed and are negative.  Physical Exam Updated Vital Signs BP 112/79 (BP Location: Right Arm)   Pulse 71   Temp 98.1 F (36.7 C) (Oral)   Resp 16   Ht 5\' 8"  (1.727 m)  Wt 77.1 kg (170 lb)   LMP 03/21/2017   SpO2 100%   BMI 25.85 kg/m   Physical Exam  Constitutional: She is oriented to person, place, and time. She appears well-developed and well-nourished. No distress.  HENT:  Head: Normocephalic and atraumatic.  Eyes: Conjunctivae and EOM are normal.  Neck: Neck supple. No tracheal deviation present.  Cardiovascular: Normal rate.   Pulmonary/Chest: Effort normal. No respiratory distress.  Musculoskeletal: Normal range of motion.    Neurological: She is alert and oriented to person, place, and time.  Minor swelling along the left anterior joint line, minimal tenderness to palpation, full active range of motion. No redness or warmth- No distal swelling or edema   Skin: Skin is warm and dry.  Psychiatric: She has a normal mood and affect. Her behavior is normal.  Nursing note and vitals reviewed.  ED Treatments / Results  Labs (all labs ordered are listed, but only abnormal results are displayed) Labs Reviewed - No data to display  EKG  EKG Interpretation None       Radiology Dg Knee Complete 4 Views Right  Result Date: 03/30/2017 CLINICAL DATA:  Posterior and medial right knee pain, onset a few days ago. Pain with flexion. Scratched EXAM: RIGHT KNEE - COMPLETE 4+ VIEW COMPARISON:  01/06/2013 FINDINGS: No evidence of fracture, dislocation, or joint effusion. No evidence of arthropathy or other focal bone abnormality. Soft tissues are unremarkable. IMPRESSION: Negative. Electronically Signed   By: Tollie Eth M.D.   On: 03/30/2017 14:16    Procedures Procedures (including critical care time)  DIAGNOSTIC STUDIES: Oxygen Saturation is 100% on RA, normal by my interpretation.    COORDINATION OF CARE: 4:10 PM Discussed treatment plan with pt at bedside and pt agreed to plan.  Medications Ordered in ED Medications - No data to display   Initial Impression / Assessment and Plan / ED Course  I have reviewed the triage vital signs and the nursing notes.  Pertinent labs & imaging results that were available during my care of the patient were reviewed by me and considered in my medical decision making (see chart for details).      Final Clinical Impressions(s) / ED Diagnoses   Final diagnoses:  Acute pain of right knee    26 year old female presents today with atraumatic knee pain.  Patient does have a small amount of fluid within her knee.  She has minor tenderness, no warmth or fever they would indicate  septic arthritis, full active range of motion.  She has no significant laxity.  Patient has no swelling in the lower extremity, low suspicion for DVT.  Patient has a knee immobilizer and crutches at home she is encouraged to use this, ibuprofen, orthopedic follow-up.  Return precautions given.  New Prescriptions Discharge Medication List as of 03/30/2017  4:30 PM     I personally performed the services described in this documentation, which was scribed in my presence. The recorded information has been reviewed and is accurate.   Eyvonne Mechanic, PA-C 03/30/17 1806    Jerelyn Scott, MD 04/01/17 [phone]

## 2017-03-30 NOTE — Discharge Instructions (Signed)
Please read attached information. If you experience any new or worsening signs or symptoms please return to the emergency room for evaluation. Please follow-up with your primary care provider or specialist as discussed.  °

## 2017-03-30 NOTE — ED Triage Notes (Addendum)
Pt reports pain to right knee from a previous knee injury, pain onset a couple of days ago. Pain with flexion. Pt denies CURRENT pain and is on her cell phone throughout the entire triage process.

## 2017-04-26 ENCOUNTER — Emergency Department (HOSPITAL_COMMUNITY)
Admission: EM | Admit: 2017-04-26 | Discharge: 2017-04-26 | Disposition: A | Discharge status: Home / Self Care | Payer: BLUE CROSS/BLUE SHIELD | Attending: Emergency Medicine | Admitting: Emergency Medicine

## 2017-04-26 ENCOUNTER — Encounter (HOSPITAL_COMMUNITY): Payer: Self-pay | Admitting: Emergency Medicine

## 2017-04-26 DIAGNOSIS — Z0000 Encounter for general adult medical examination without abnormal findings: Secondary | ICD-10-CM | POA: Diagnosis present

## 2017-04-26 DIAGNOSIS — G8929 Other chronic pain: Secondary | ICD-10-CM | POA: Insufficient documentation

## 2017-04-26 DIAGNOSIS — M25561 Pain in right knee: Secondary | ICD-10-CM | POA: Diagnosis not present

## 2017-04-26 NOTE — ED Notes (Signed)
See PAs notes for assessment 

## 2017-04-26 NOTE — Discharge Instructions (Signed)
As we discussed, you need to follow up with a primary care doctor or an orthopedic doctor on an outpatient basis to obtain medical clearance. I provided you with the [facility]. You can call and arrange for an appointment with them. I have also provided with an orthopedic referral.  Return the emergency Department for any worsening pain, redness and swelling of the knee, fevers, difficulty walking or any other worsening or concerning symptoms.

## 2017-04-26 NOTE — ED Notes (Signed)
Pt A&Ox4, ambulatory at d/c with steady gait, NAD. Declined wheelchair. 

## 2017-04-26 NOTE — ED Triage Notes (Signed)
Pt here for physical exam/ medical clearance from right knee injury patient states was 4 years ago. 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 Pt ambulatory, denies pain, reports no issues with right knee. VSS.

## 2017-04-26 NOTE — ED Notes (Signed)
See edp assessment 

## 2017-04-26 NOTE — ED Provider Notes (Signed)
[facility]-EMERGENCY DEPT Provider Note   CSN: [id number] Arrival date & time: 04/26/17  1326     History   Chief Complaint Chief Complaint  Patient presents with  . SPORTSEXAM    HPI Alexandria Ellis is a 26 y.o. female who presents wanting a medical clearance for basic training for the [employer]. Patient reports that she has a history of a knee injury partially 4 years ago. Patient states that at that time she had a patellar dislocation. Patient was recently seen in the ED on 03/30/17 for persistent right knee pain and swelling. X-rays at that time were unremarkable. She was discharged home with a knee immobilizer and crutches. Patient was provided orthopedic follow-up at the time but did not take an appointment. She presents today with no complaints of new or worsening pain. No history of new trauma or injury. She reports that she is attempting to join basic training at [employer] and needs a note to medically clear her knee so she can participate. Patient has been able to injury without any difficulty. She denies any fever, swelling to the knee, redness or warmth to the knee, numbness/weakness of her legs.  The history is provided by the patient.    History reviewed. No pertinent past medical history.  There are no active problems to display for this patient.   History reviewed. No pertinent surgical history.  OB History    No data available       Home Medications    Prior to Admission medications   Medication Sig Start Date End Date Taking? Authorizing Provider  cephALEXin (KEFLEX) 500 MG capsule Take 1 capsule (500 mg total) by mouth 2 (two) times daily. For 7 days Patient not taking: Reported on 02/28/2017 07/05/16   Lurene ShadowPhelps, Erin O, PA-C  diclofenac (VOLTAREN) 50 MG EC tablet Take 1 tablet (50 mg total) by mouth 2 (two) times daily. Patient not taking: Reported on 02/28/2017 11/15/16   Janne NapoleonNeese, Hope M, NP  HYDROcodone-acetaminophen (NORCO/VICODIN) 5-325 MG tablet Take 1 tablet by  mouth every 6 (six) hours as needed. Patient not taking: Reported on 02/28/2017 08/07/15   Carlene CoriaSam, Serena Y, PA-C  loperamide (IMODIUM) 2 MG capsule Take 1 capsule (2 mg total) by mouth 4 (four) times daily as needed for diarrhea or loose stools. 02/28/17   Barrett HenleNadeau, Nicole Elizabeth, PA-C  methocarbamol (ROBAXIN) 500 MG tablet Take 1 tablet (500 mg total) by mouth 2 (two) times daily. 08/07/15   Sam, Ace GinsSerena Y, PA-C  metroNIDAZOLE (FLAGYL) 500 MG tablet Take 1 tablet (500 mg total) by mouth 2 (two) times daily. Patient not taking: Reported on 09/29/2014 08/08/13   Earley FavorSchulz, Gail, NP  ondansetron (ZOFRAN ODT) 4 MG disintegrating tablet Take 1 tablet (4 mg total) by mouth every 8 (eight) hours as needed for nausea or vomiting. 02/28/17   Barrett HenleNadeau, Nicole Elizabeth, PA-C    Family History History reviewed. No pertinent family history.  Social History Social History  Substance Use Topics  . Smoking status: Never Smoker  . Smokeless tobacco: Never Used  . Alcohol use Yes     Comment: socially     Allergies   Patient has no known allergies.   Review of Systems Review of Systems  Constitutional: Negative for fever.  Musculoskeletal:       Chronic knee pain  Skin: Negative for color change.  Neurological: Negative for weakness and numbness.     Physical Exam Updated Vital Signs BP 110/69 (BP Location: Right Arm)   Pulse  83   Temp 98.4 F (36.9 C) (Oral)   Resp 16   Wt 77.1 kg (170 lb)   SpO2 97%   BMI 25.85 kg/m   Physical Exam  Constitutional: She appears well-developed and well-nourished.  HENT:  Head: Normocephalic and atraumatic.  Eyes: Conjunctivae and EOM are normal. Right eye exhibits no discharge. Left eye exhibits no discharge. No scleral icterus.  Pulmonary/Chest: Effort normal.  Musculoskeletal:  Chronic changes noted to the right knee. No evidence of edema, erythema, warmth, ecchymosis. No deformity or crepitus. Full flexion extension of right knee intact without any  difficulty. Negative anterior and posterior drawer test. No instability noted on varus or valgus stress. No abnormalities of the left lower extremity.  Neurological: She is alert.  Skin: Skin is warm and dry.  Psychiatric: She has a normal mood and affect. Her speech is normal and behavior is normal.  Nursing note and vitals reviewed.    ED Treatments / Results  Labs (all labs ordered are listed, but only abnormal results are displayed) Labs Reviewed - No data to display  EKG  EKG Interpretation None       Radiology No results found.  Procedures Procedures (including critical care time)  Medications Ordered in ED Medications - No data to display   Initial Impression / Assessment and Plan / ED Course  I have reviewed the triage vital signs and the nursing notes.  Pertinent labs & imaging results that were available during my care of the patient were reviewed by me and considered in my medical decision making (see chart for details).     26 year old female who presents wanting a medical clearance to purchase pain basic training for the [employer]. Patient has a history of a patellar dislocation 4 years ago. Recently seen on 03/30/17 for knee pain. X-rays negative at that time. Patient has been using a knee immobilizer and crutches. Patient states that she needs a medical clearance note or her knee stating that she can participate in basic training. Patient was provided an orthopedic referral and her last emergency department visit but did not make an appointment with them. Explained to patient that I cannot provide her a medical clearance in the emergency department. I explained that I would be happy to repeat an x-ray to evaluate for fracture or dislocation but that x-rays wouldn't not allow me to know any information about the ligaments or tendons in the knee and I cannot medically clear her felt that knowledge. Advised patient to follow-up with a primary care doctor or orthopedic  doctor to evaluate the knee and provide medical clearance. History/physical examination concerning for septic arthritis or DVT. Given lack of new fall or injury and physical exam, low suspicion for fracture dislocation. Patient does not wish to have x-rays at this time. Plan to provide patient with outpatient orthopedic referral. Provided patient with a list of clinic resources to use if he does not have a PCP. Instructed to call them today to arrange follow-up in the next 24-48 hours. Strict eturn precautions discussed. Patient expresses understanding and agreement to plan.    Final Clinical Impressions(s) / ED Diagnoses   Final diagnoses:  Chronic pain of right knee    New Prescriptions New Prescriptions   No medications on file     Rosana HoesLayden, Lindsey A, PA-C 04/26/17 1421    Doug SouJacubowitz, Sam, MD 04/26/17 1659

## 2017-06-11 ENCOUNTER — Encounter (HOSPITAL_COMMUNITY): Payer: Self-pay

## 2017-06-11 ENCOUNTER — Emergency Department (HOSPITAL_COMMUNITY)
Admission: EM | Admit: 2017-06-11 | Discharge: 2017-06-11 | Disposition: A | Discharge status: Home / Self Care | Payer: BLUE CROSS/BLUE SHIELD | Attending: Emergency Medicine | Admitting: Emergency Medicine

## 2017-06-11 ENCOUNTER — Emergency Department (HOSPITAL_COMMUNITY): Payer: BLUE CROSS/BLUE SHIELD

## 2017-06-11 DIAGNOSIS — R0789 Other chest pain: Secondary | ICD-10-CM | POA: Diagnosis not present

## 2017-06-11 DIAGNOSIS — R0781 Pleurodynia: Secondary | ICD-10-CM

## 2017-06-11 MED ORDER — HYDROCODONE-ACETAMINOPHEN 5-325 MG PO TABS
1.0000 | ORAL_TABLET | Freq: Once | ORAL | Status: AC
  Administered 2017-06-11: 1 via ORAL
  Filled 2017-06-11: qty 1

## 2017-06-11 MED ORDER — HYDROCODONE-ACETAMINOPHEN 5-325 MG PO TABS: 1.0000 | ORAL_TABLET | Freq: Four times a day (QID) | ORAL | 0 refills | Status: DC | PRN

## 2017-06-11 MED ORDER — METHOCARBAMOL 500 MG PO TABS: 500.0000 mg | ORAL_TABLET | Freq: Two times a day (BID) | ORAL | 0 refills | Status: DC | PRN
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
## 2017-06-11 NOTE — Discharge Instructions (Signed)
It was my pleasure taking care of you today!   Fortunately, your imaging today is very reassuring.   You will need to follow up with the trauma service at [facility] as you were directed. Please call them tomorrow to schedule this appointment.   Ibuprofen as needed for mild to moderate pain. Pain medication is to be taken only for severe pain. Robaxin is your muscle relaxer to take as needed.   Return to ER for new or worsening symptoms, any additional concerns.   Do not drink alcohol, drive or participate in any other potentially dangerous activities while taking opiate pain medication as it may make you sleepy. Do not take this medication with any other sedating medications, either prescription or over-the-counter. If you were prescribed Percocet or Vicodin, do not take these with acetaminophen (Tylenol) as it is already contained within these medications.   This medication is an opiate (or narcotic) pain medication and can be habit forming.  Use it as little as possible to achieve adequate pain control.  Do not use or use it with extreme caution if you have a history of opiate abuse or dependence. This medication is intended for your use only - do not give any to anyone else and keep it in a secure place where nobody else, especially children, have access to it. It will also cause or worsen constipation, so you may want to consider taking an over-the-counter stool softener while you are taking this medication.

## 2018-05-08 ENCOUNTER — Other Ambulatory Visit: Payer: Self-pay | Admitting: Occupational Medicine

## 2018-05-08 ENCOUNTER — Encounter (HOSPITAL_COMMUNITY): Payer: Self-pay

## 2018-05-08 ENCOUNTER — Emergency Department (HOSPITAL_COMMUNITY)
Admission: EM | Admit: 2018-05-08 | Discharge: 2018-05-08 | Disposition: A | Discharge status: Home / Self Care | Payer: No Typology Code available for payment source | Attending: Emergency Medicine | Admitting: Emergency Medicine

## 2018-05-08 ENCOUNTER — Ambulatory Visit: Payer: Self-pay

## 2018-05-08 DIAGNOSIS — S6992XA Unspecified injury of left wrist, hand and finger(s), initial encounter: Secondary | ICD-10-CM | POA: Diagnosis present

## 2018-05-08 DIAGNOSIS — Y929 Unspecified place or not applicable: Secondary | ICD-10-CM | POA: Insufficient documentation

## 2018-05-08 DIAGNOSIS — Y990 Civilian activity done for income or pay: Secondary | ICD-10-CM | POA: Insufficient documentation

## 2018-05-08 DIAGNOSIS — Y9389 Activity, other specified: Secondary | ICD-10-CM | POA: Insufficient documentation

## 2018-05-08 DIAGNOSIS — S62623B Displaced fracture of medial phalanx of left middle finger, initial encounter for open fracture: Secondary | ICD-10-CM | POA: Insufficient documentation

## 2018-05-08 DIAGNOSIS — M79645 Pain in left finger(s): Secondary | ICD-10-CM

## 2018-05-08 DIAGNOSIS — W208XXA Other cause of strike by thrown, projected or falling object, initial encounter: Secondary | ICD-10-CM | POA: Diagnosis not present

## 2018-05-08 MED ORDER — CEPHALEXIN 500 MG PO CAPS: 500.0000 mg | ORAL_CAPSULE | Freq: Four times a day (QID) | ORAL | 0 refills | Status: AC

## 2018-05-08 MED ORDER — CEFAZOLIN SODIUM-DEXTROSE 2-4 GM/100ML-% IV SOLN
2.0000 g | Freq: Once | INTRAVENOUS | Status: AC
  Administered 2018-05-08: 2 g via INTRAVENOUS
  Filled 2018-05-08: qty 100

## 2018-05-08 MED ORDER — OXYCODONE-ACETAMINOPHEN 5-325 MG PO TABS: 1.0000 | ORAL_TABLET | Freq: Four times a day (QID) | ORAL | 0 refills | Status: DC | PRN

## 2018-05-08 MED ORDER — LIDOCAINE HCL (PF) 1 % IJ SOLN
10.0000 mL | Freq: Once | INTRAMUSCULAR | Status: AC
  Administered 2018-05-08: 10 mL
  Filled 2018-05-08: qty 10

## 2018-05-08 MED ORDER — HYDROCODONE-ACETAMINOPHEN 5-325 MG PO TABS
2.0000 | ORAL_TABLET | Freq: Once | ORAL | Status: AC
  Administered 2018-05-08: 2 via ORAL
  Filled 2018-05-08: qty 2
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
## 2018-05-08 NOTE — Discharge Instructions (Signed)
Please take Ibuprofen (Advil, motrin) and Tylenol (acetaminophen) to relieve your pain.  You may take up to 600 MG (3 pills) of normal strength ibuprofen every 8 hours as needed.  In between doses of ibuprofen you make take tylenol, up to 1,000 mg (two extra strength pills).  Do not take more than 3,000 mg tylenol in a 24 hour period.  Please check all medication labels as many medications such as pain and cold medications may contain tylenol.  Do not drink alcohol while taking these medications.  Do not take other NSAID'S while taking ibuprofen (such as aleve or naproxen).  Please take ibuprofen with food to decrease stomach upset. ° °Today you received medications that may make you sleepy or impair your ability to make decisions.  For the next 24 hours please do not drive, operate heavy machinery, care for a small child with out another adult present, or perform any activities that may cause harm to you or someone else if you were to fall asleep or be impaired.  ° °You are being prescribed a medication which may make you sleepy. Please follow up of listed precautions for at least 24 hours after taking one dose. ° °You may have diarrhea from the antibiotics.  It is very important that you continue to take the antibiotics even if you get diarrhea unless a medical professional tells you that you may stop taking them.  If you stop too early the bacteria you are being treated for will become stronger and you may need different, more powerful antibiotics that have more side effects and worsening diarrhea.  Please stay well hydrated and consider probiotics as they may decrease the severity of your diarrhea.  Please be aware that if you take any hormonal contraception (birth control pills, nexplanon, the ring, etc) that your birth control will not work while you are taking antibiotics and you need to use back up protection as directed on the birth control medication information insert.  ° °
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
## 2018-05-08 NOTE — ED Notes (Signed)
See provider note for assessment

## 2019-01-02 ENCOUNTER — Encounter (HOSPITAL_COMMUNITY): Payer: Self-pay

## 2019-01-02 ENCOUNTER — Emergency Department (HOSPITAL_COMMUNITY)
Admission: EM | Admit: 2019-01-02 | Discharge: 2019-01-02 | Disposition: A | Discharge status: Home / Self Care | Payer: BLUE CROSS/BLUE SHIELD | Attending: Emergency Medicine | Admitting: Emergency Medicine

## 2019-01-02 ENCOUNTER — Other Ambulatory Visit: Payer: Self-pay

## 2019-01-02 DIAGNOSIS — Z79899 Other long term (current) drug therapy: Secondary | ICD-10-CM | POA: Diagnosis not present

## 2019-01-02 DIAGNOSIS — Z041 Encounter for examination and observation following transport accident: Secondary | ICD-10-CM | POA: Insufficient documentation

## 2019-01-02 NOTE — ED Notes (Signed)
Patient verbalizes understanding of discharge instructions. Opportunity for questioning and answers were provided. Armband removed by staff, pt discharged from ED.  
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
## 2019-01-02 NOTE — Discharge Instructions (Addendum)
Please read attached information. If you experience any new or worsening signs or symptoms please return to the emergency room for evaluation. Please follow-up with your primary care provider or specialist as discussed.  °

## 2019-06-07 ENCOUNTER — Encounter (HOSPITAL_COMMUNITY): Payer: Self-pay

## 2019-06-07 ENCOUNTER — Ambulatory Visit (HOSPITAL_COMMUNITY)
Admission: EM | Admit: 2019-06-07 | Discharge: 2019-06-07 | Disposition: A | Discharge status: Home / Self Care | Payer: BC Managed Care – PPO | Attending: Emergency Medicine | Admitting: Emergency Medicine

## 2019-06-07 DIAGNOSIS — R21 Rash and other nonspecific skin eruption: Secondary | ICD-10-CM | POA: Diagnosis not present

## 2019-06-07 MED ORDER — CLOTRIMAZOLE-BETAMETHASONE 1-0.05 % EX CREA: TOPICAL_CREAM | CUTANEOUS | 0 refills | Status: AC

## 2019-06-07 NOTE — ED Provider Notes (Signed)
[facility]    CSN: [id number] Arrival date & time: 06/07/19  0802      History   Chief Complaint Chief Complaint  Patient presents with  . Rash    HPI Alexandria Ellis is a 28 y.o. female.   Patient presents with pruritic rash on left ankle intermittently for 1 month.  She has used hydrocortisone cream which resolved the rash but then it returned approximately 1 week ago.  She states she wears work boots and sweats in them.  She denies other rash, pain, fever, chills, or other symptoms.  LMP: 05/13/2019  The history is provided by the patient.    History reviewed. No pertinent past medical history.  There are no active problems to display for this patient.   History reviewed. No pertinent surgical history.  OB History   No obstetric history on file.      Home Medications    Prior to Admission medications   Medication Sig Start Date End Date Taking? Authorizing Provider  clotrimazole-betamethasone (LOTRISONE) cream Apply to affected area 2 times daily prn 06/07/19   Sharion Balloon, NP  oxyCODONE-acetaminophen (PERCOCET/ROXICET) 5-325 MG tablet Take 1 tablet by mouth every 6 (six) hours as needed for severe pain. 05/08/18   Lorin Glass, PA-C    Family History Family History  Problem Relation Age of Onset  . Healthy Mother   . Healthy Father     Social History Social History   Tobacco Use  . Smoking status: Never Smoker  . Smokeless tobacco: Never Used  Substance Use Topics  . Alcohol use: Yes    Comment: socially  . Drug use: No     Allergies   Patient has no known allergies.   Review of Systems Review of Systems  Constitutional: Negative for chills and fever.  HENT: Negative for ear pain and sore throat.   Eyes: Negative for pain and visual disturbance.  Respiratory: Negative for cough and shortness of breath.   Cardiovascular: Negative for chest pain and palpitations.  Gastrointestinal: Negative for abdominal pain and  vomiting.  Genitourinary: Negative for dysuria and hematuria.  Musculoskeletal: Negative for arthralgias and back pain.  Skin: Positive for rash. Negative for color change.  Neurological: Negative for seizures and syncope.  All other systems reviewed and are negative.    Physical Exam Triage Vital Signs ED Triage Vitals  Enc Vitals Group     BP      Pulse      Resp      Temp      Temp src      SpO2      Weight      Height      Head Circumference      Peak Flow      Pain Score      Pain Loc      Pain Edu?      Excl. in [place]?    No data found.  Updated Vital Signs BP 108/72 (BP Location: Right Arm)   Pulse 76   Temp 98.4 F (36.9 C) (Oral)   SpO2 98%   Visual Acuity Right Eye Distance:   Left Eye Distance:   Bilateral Distance:    Right Eye Near:   Left Eye Near:    Bilateral Near:     Physical Exam Vitals signs and nursing note reviewed.  Constitutional:      General: She is not in acute distress.    Appearance: She is well-developed.  HENT:     Head: Normocephalic and atraumatic.  Eyes:     Conjunctiva/sclera: Conjunctivae normal.  Neck:     Musculoskeletal: Neck supple.  Cardiovascular:     Rate and Rhythm: Normal rate and regular rhythm.     Heart sounds: No murmur.  Pulmonary:     Effort: Pulmonary effort is normal. No respiratory distress.     Breath sounds: Normal breath sounds.  Abdominal:     Palpations: Abdomen is soft.     Tenderness: There is no abdominal tenderness.  Skin:    General: Skin is warm and dry.     Capillary Refill: Capillary refill takes less than 2 seconds.     Findings: Rash present.     Comments: Erythematous papular, patchy rash on lateral and medial aspects of left ankle.  No drainage.  See pictures for details.    Neurological:     General: No focal deficit present.     Mental Status: She is alert and oriented to person, place, and time.     Sensory: No sensory deficit.     Motor: No weakness.     Gait: Gait  normal.          UC Treatments / Results  Labs (all labs ordered are listed, but only abnormal results are displayed) Labs Reviewed - No data to display  EKG   Radiology No results found.  Procedures Procedures (including critical care time)  Medications Ordered in UC Medications - No data to display  Initial Impression / Assessment and Plan / UC Course  I have reviewed the triage vital signs and the nursing notes.  Pertinent labs & imaging results that were available during my care of the patient were reviewed by me and considered in my medical decision making (see chart for details).    Rash on left ankle.  Treating with Lotrisone cream.  Instructed patient to return here or follow-up with her PCP if her rash continues or worsens;  or if she develops new symptoms such as fever, chills, pain, or other symptoms.  Patient agrees with plan of care.     Final Clinical Impressions(s) / UC Diagnoses   Final diagnoses:  Rash     Discharge Instructions     Use the cream as prescribed.    Return here or follow up with your primary care provider if your rash continues or worsens;  or if you develop fever, chills, pain, or other concerning symptoms.        ED Prescriptions    Medication Sig Dispense Auth. Provider   clotrimazole-betamethasone (LOTRISONE) cream Apply to affected area 2 times daily prn 15 g Mickie Bailate, Tensley Wery H, NP     Controlled Substance Prescriptions [place] Controlled Substance Registry consulted? Not Applicable   Mickie Bailate, Caprisha Bridgett H, NP 06/07/19 [phone]

## 2019-06-07 NOTE — Discharge Instructions (Addendum)
Use the cream as prescribed.    Return here or follow up with your primary care provider if your rash continues or worsens;  or if you develop fever, chills, pain, or other concerning symptoms.

## 2019-06-07 NOTE — ED Triage Notes (Signed)
Patient reports having a rash on and off, in her left ankle for a month.Patient denies pain or trouble with gait.
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
## 2022-03-06 ENCOUNTER — Emergency Department (HOSPITAL_COMMUNITY)
Admission: EM | Admit: 2022-03-06 | Discharge: 2022-03-07 | Discharge status: Against Medical Advice | Payer: 59 | Attending: Physician Assistant | Admitting: Physician Assistant

## 2022-03-06 ENCOUNTER — Other Ambulatory Visit: Payer: Self-pay

## 2022-03-06 ENCOUNTER — Encounter (HOSPITAL_COMMUNITY): Payer: Self-pay

## 2022-03-06 DIAGNOSIS — N898 Other specified noninflammatory disorders of vagina: Secondary | ICD-10-CM | POA: Insufficient documentation

## 2022-03-06 DIAGNOSIS — Z5321 Procedure and treatment not carried out due to patient leaving prior to being seen by health care provider: Secondary | ICD-10-CM | POA: Diagnosis not present

## 2022-03-06 DIAGNOSIS — R300 Dysuria: Secondary | ICD-10-CM | POA: Insufficient documentation

## 2022-03-06 LAB — URINALYSIS, ROUTINE W REFLEX MICROSCOPIC
Bilirubin Urine: NEGATIVE
Glucose, UA: NEGATIVE mg/dL
Ketones, ur: NEGATIVE mg/dL
Nitrite: NEGATIVE
Protein, ur: NEGATIVE mg/dL
Specific Gravity, Urine: 1.025 (ref 1.005–1.030)
pH: 5 (ref 5.0–8.0)

## 2022-03-06 LAB — PREGNANCY, URINE: Preg Test, Ur: NEGATIVE

## 2022-03-06 NOTE — ED Notes (Signed)
Patient called x2 for room assignment with no response  

## 2022-03-06 NOTE — ED Provider Triage Note (Signed)
Emergency Medicine Provider Triage Evaluation Note  Alexandria Ellis , a 31 y.o. female  was evaluated in triage.  Pt complains of vaginal discharge, noted that it was brown.  However thought it was her period, however this continued to worsen, she is also having some urinary discomfort. LMP unknwon.  Review of Systems  Positive: Vaginal discharge Negative: Abdominal pain  Physical Exam  BP 120/77 (BP Location: Right Arm)   Pulse 83   Temp 98.4 F (36.9 C) (Oral)   Resp 16   Ht 5\' 8"  (1.727 m)   Wt 82.1 kg   SpO2 99%   BMI 27.52 kg/m  Gen:   Awake, no distress   Resp:  Normal effort  MSK:   Moves extremities without difficulty  Other:    Medical Decision Making  Medically screening exam initiated at 5:11 PM.  Appropriate orders placed.  Alexandria Ellis was informed that the remainder of the evaluation will be completed by another provider, this initial triage assessment does not replace that evaluation, and the importance of remaining in the ED until their evaluation is complete.     [name], PA-C 03/06/22 1714

## 2022-03-06 NOTE — ED Triage Notes (Signed)
Patient reports new vaginal discharge and itching.  Reports it is brown.  Denies odor.  Reports it has been going on since yesterday. Denies new sexual partners

## 2023-02-13 ENCOUNTER — Other Ambulatory Visit: Payer: Self-pay | Admitting: Family Medicine

## 2023-02-13 ENCOUNTER — Ambulatory Visit: Payer: Self-pay

## 2023-02-13 DIAGNOSIS — M25512 Pain in left shoulder: Secondary | ICD-10-CM

## 2023-04-20 ENCOUNTER — Other Ambulatory Visit: Payer: Self-pay | Admitting: Family Medicine

## 2023-04-20 ENCOUNTER — Ambulatory Visit: Payer: Self-pay

## 2023-04-20 DIAGNOSIS — M25562 Pain in left knee: Secondary | ICD-10-CM

## 2023-11-29 ENCOUNTER — Inpatient Hospital Stay (HOSPITAL_COMMUNITY)
Admission: AD | Admit: 2023-11-29 | Discharge: 2023-11-29 | Disposition: A | Discharge status: Home / Self Care | Attending: Obstetrics & Gynecology | Admitting: Obstetrics & Gynecology

## 2023-11-29 ENCOUNTER — Encounter (HOSPITAL_COMMUNITY): Payer: Self-pay | Admitting: Obstetrics & Gynecology

## 2023-11-29 ENCOUNTER — Inpatient Hospital Stay (HOSPITAL_COMMUNITY)

## 2023-11-29 DIAGNOSIS — R109 Unspecified abdominal pain: Secondary | ICD-10-CM | POA: Insufficient documentation

## 2023-11-29 DIAGNOSIS — O034 Incomplete spontaneous abortion without complication: Secondary | ICD-10-CM

## 2023-11-29 DIAGNOSIS — O0330 Unspecified complication following incomplete spontaneous abortion: Secondary | ICD-10-CM | POA: Diagnosis present

## 2023-11-29 DIAGNOSIS — Z3A09 9 weeks gestation of pregnancy: Secondary | ICD-10-CM

## 2023-11-29 DIAGNOSIS — O019 Hydatidiform mole, unspecified: Secondary | ICD-10-CM | POA: Diagnosis not present

## 2023-11-29 DIAGNOSIS — O039 Complete or unspecified spontaneous abortion without complication: Secondary | ICD-10-CM

## 2023-11-29 DIAGNOSIS — R1110 Vomiting, unspecified: Secondary | ICD-10-CM | POA: Diagnosis present

## 2023-11-29 DIAGNOSIS — O209 Hemorrhage in early pregnancy, unspecified: Secondary | ICD-10-CM

## 2023-11-29 DIAGNOSIS — O26891 Other specified pregnancy related conditions, first trimester: Secondary | ICD-10-CM

## 2023-11-29 HISTORY — DX: Other specified health status: Z78.9

## 2023-11-29 LAB — CBC
HCT: 38.9 % (ref 36.0–46.0)
Hemoglobin: 13.1 g/dL (ref 12.0–15.0)
MCH: 30.3 pg (ref 26.0–34.0)
MCHC: 33.7 g/dL (ref 30.0–36.0)
MCV: 89.8 fL (ref 80.0–100.0)
Platelets: 252 10*3/uL (ref 150–400)
RBC: 4.33 MIL/uL (ref 3.87–5.11)
RDW: 12.8 % (ref 11.5–15.5)
WBC: 8.8 10*3/uL (ref 4.0–10.5)
nRBC: 0 % (ref 0.0–0.2)

## 2023-11-29 LAB — TYPE AND SCREEN
ABO/RH(D): O POS
Antibody Screen: NEGATIVE

## 2023-11-29 LAB — HCG, QUANTITATIVE, PREGNANCY: hCG, Beta Chain, Quant, S: 28101 m[IU]/mL — ABNORMAL HIGH (ref <5)

## 2023-11-29 MED ORDER — ONDANSETRON 4 MG PO TBDP: 4.0000 mg | ORAL_TABLET | Freq: Three times a day (TID) | ORAL | 0 refills | Status: AC | PRN

## 2023-11-29 MED ORDER — OXYCODONE HCL 5 MG PO CAPS: 5.0000 mg | ORAL_CAPSULE | ORAL | 0 refills | Status: AC | PRN

## 2023-11-29 MED ORDER — HYDROMORPHONE HCL 1 MG/ML IJ SOLN
1.0000 mg | Freq: Once | INTRAMUSCULAR | Status: AC
  Administered 2023-11-29: 1 mg via INTRAVENOUS
  Filled 2023-11-29: qty 1

## 2023-11-29 MED ORDER — ONDANSETRON 4 MG PO TBDP
4.0000 mg | ORAL_TABLET | Freq: Once | ORAL | Status: AC
  Administered 2023-11-29: 4 mg via ORAL
  Filled 2023-11-29: qty 1

## 2023-11-29 MED ORDER — SODIUM CHLORIDE 0.9 % IV SOLN
25.0000 mg | Freq: Once | INTRAVENOUS | Status: AC
  Administered 2023-11-29: 25 mg via INTRAVENOUS
  Filled 2023-11-29: qty 1

## 2023-11-29 MED ORDER — LACTATED RINGERS IV BOLUS
1000.0000 mL | Freq: Once | INTRAVENOUS | Status: AC
  Administered 2023-11-29: 1000 mL via INTRAVENOUS

## 2023-11-29 MED ORDER — MISOPROSTOL 200 MCG PO TABS
800.0000 ug | ORAL_TABLET | Freq: Once | ORAL | Status: AC
  Administered 2023-11-29: 800 ug via BUCCAL
  Filled 2023-11-29: qty 4

## 2023-11-29 NOTE — Consult Note (Signed)
 Called by Arita Miss CNM regarding patients.   33Y [id number] @ [redacted]w[redacted]d by LMP, receives care at [facility], had scan 2/26 which showed IUP, small fetal pole (2.74mm) without cardiac activity. Went to urgent care this evening for VB. In MAU, initial QBL and then bleeding slowed. US showed no IUP, thickened heterogeneous endometrium. Hgb 13.2. Called by MAU provider for consideration of D&C.    Today's Vitals   11/29/23 1910 11/29/23 1922 11/29/23 2043 11/29/23 2125  BP:    135/85  Pulse:    73  Resp:      Temp:      TempSrc:      SpO2:      PainSc: 5  6  1  1     There is no height or weight on file to calculate BMI.  I assessed patient at bedside, patient appears tearful. Reports just passed a clot and reports pain has decreased from 10/10 to 5/10. On pad, a clementine size clot is present, no active bleeding noted at perineum.   I discussed miscarriage management options with patient. Gestational sac has passed but appears that there is still some clot vs. POCs still present on [place]. Options provided for [facility], medical management with misoprostol, or expectant management. Though bleeding has slowed since arrival, given the amount of bleeding she had on arrival, would recommend active management with D&C vs. misoprostol. Patient expressed desire to avoid surgery. We discussed expected course of misoprostol and there is still a risk of D&C if misoprostol is unsuccessful.   Discussed with MAU team - patient wishes to be given misoprostol in MAU with pain/nausea treatment,  when stable will discharge home. MAU team will provide patient with return precautions and will send rx for oxycodone and zofran to pharmacy. I will send message to [facility] to follow up with patient in AM.   Alinda Deem MD 11/29/23 9:34 PM

## 2023-11-29 NOTE — MAU Provider Note (Signed)
 History     CSN: [id number]  Arrival date and time: 11/29/23 1557   Event Date/Time   First Provider Initiated Contact with Patient 11/29/23 [phone]      Chief Complaint  Patient presents with   Vaginal Bleeding   HPI Ms. Alexandria Ellis is a 33 y.o. year old [id number] female at Unknown weeks gestation who arrives to MAU via EMS reporting she was seen at Urgent Care on [facility] for heavy vaginal bleeding. She reports she had an U/S at [facility] "a couple of weeks ago" and was told that the baby was measuring about 5 weeks; even though she was supposed to be 8 weeks. Her mother-in-law is present and contributing to the history taking.   OB History     Gravida  3   Para      Term      Preterm      AB  2   Living         SAB  1   IAB  1   Ectopic      Multiple      Live Births              Past Medical History:  Diagnosis Date   Medical history non-contributory     Past Surgical History:  Procedure Laterality Date   DILATION AND CURETTAGE OF UTERUS     WISDOM TOOTH EXTRACTION      Family History  Problem Relation Age of Onset   Healthy Mother    Healthy Father     Social History   Tobacco Use   Smoking status: Never   Smokeless tobacco: Never  Vaping Use   Vaping status: Never Used  Substance Use Topics   Alcohol use: Yes    Comment: socially   Drug use: No    Allergies:  Allergies  Allergen Reactions   Cephalexin Itching    Medications Prior to Admission  Medication Sig Dispense Refill Last Dose/Taking   clotrimazole-betamethasone (LOTRISONE) cream Apply to affected area 2 times daily prn 15 g 0    oxyCODONE-acetaminophen (PERCOCET/ROXICET) 5-325 MG tablet Take 1 tablet by mouth every 6 (six) hours as needed for severe pain. 10 tablet 0     Review of Systems  Constitutional: Negative.   HENT: Negative.    Eyes: Negative.   Respiratory: Negative.    Cardiovascular: Negative.   Gastrointestinal: Negative.    Endocrine: Negative.   Genitourinary:  Positive for pelvic pain and vaginal bleeding.  Musculoskeletal: Negative.   Skin: Negative.   Allergic/Immunologic: Negative.   Neurological: Negative.   Hematological: Negative.   Psychiatric/Behavioral: Negative.     Physical Exam   Blood pressure 124/84, pulse 81, temperature 98.2 F (36.8 C), temperature source Oral, resp. rate 16, last menstrual period 09/24/2023, SpO2 100%.  Physical Exam Vitals and nursing note reviewed. Exam conducted with a chaperone present.  Constitutional:      Appearance: Normal appearance. She is obese.  Pulmonary:     Effort: Pulmonary effort is normal.  Abdominal:     Palpations: Abdomen is soft.  Genitourinary:    General: Normal vulva.     Comments: Pelvic exam: External genitalia normal, SE: vaginal walls pink and well rugated, cervix is smooth, pink, no lesions, copious amt of bright, red blood and large blood clots removes with numerous large cotton-tipped swabs in vaginal vault -- cervix visually closed, bimanual exam deferred.  Musculoskeletal:        General: Normal range of  motion.  Skin:    General: Skin is warm and dry.  Neurological:     Mental Status: She is alert and oriented to person, place, and time.  Psychiatric:        Mood and Affect: Mood normal.        Behavior: Behavior normal.        Thought Content: Thought content normal.        Judgment: Judgment normal.   Reassessment @ 1910: Patient requesting additional pain medication. Reassessment @ 1940: Patient just vomited in the floor. Spouse is supportive at bedside. In to discuss results of U/S and recommendations of attending MDs to get scheduled for D&E to remove retained POC. RN reports that bleeding is scant to small amount on pad when last changed. Total QBL 329 mL with Triton. MAU Course  Procedures  MDM Start IV CBC Type & Screen HCG OB TVUS Start IV LR bolus 1 liter Dilaudid 1 mg IVP x 2 doses Phenergan 25 mg  IVPB Surgical Pathology  Results for orders placed or performed during the hospital encounter of 11/29/23 (from the past 24 hours)  CBC     Status: None   Collection Time: 11/29/23  4:37 PM  Result Value Ref Range   WBC 8.8 4.0 - 10.5 K/uL   RBC 4.33 3.87 - 5.11 MIL/uL   Hemoglobin 13.1 12.0 - 15.0 g/dL   HCT 16.1 09.6 - 04.5 %   MCV 89.8 80.0 - 100.0 fL   MCH 30.3 26.0 - 34.0 pg   MCHC 33.7 30.0 - 36.0 g/dL   RDW 40.9 81.1 - 91.4 %   Platelets 252 150 - 400 K/uL   nRBC 0.0 0.0 - 0.2 %  Type and screen     Status: None   Collection Time: 11/29/23  4:37 PM  Result Value Ref Range   ABO/RH(D) O POS    Antibody Screen NEG    Sample Expiration      12/02/2023,2359 Performed at [facility], [address], [place], [place] [address]   hCG, quantitative, pregnancy     Status: Abnormal   Collection Time: 11/29/23  4:37 PM  Result Value Ref Range   hCG, Beta Chain, Quant, S 28,101 (H) <5 mIU/mL    US OB LESS THAN 14 WEEKS WITH OB TRANSVAGINAL Result Date: 11/29/2023 CLINICAL DATA:  Vaginal bleeding HCG greater than 26,000 EXAM: OBSTETRIC <14 WK [place] AND TRANSVAGINAL OB US TECHNIQUE: Both transabdominal and transvaginal ultrasound examinations were performed for complete evaluation of the gestation as well as the maternal uterus, adnexal regions, and pelvic cul-de-sac. Transvaginal technique was performed to assess early pregnancy. COMPARISON:  None Available. FINDINGS: Intrauterine gestational sac: Not visualized Yolk sac:  Not Visualized. Embryo:  Not Visualized. Maternal uterus/adnexae: Possible small fibroids at the lower uterine segment measuring up to 10 mm. Ovaries are within normal limits. Left ovary measures 3.1 x 1.9 x 3.2 cm. Right ovary measures 1.8 x 3.6 x 1.9 cm and contains probable corpus luteum. No significant free fluid. Heterogenous endometrial thickening. IMPRESSION: No IUP identified. Findings consistent with pregnancy of unknown location, given HCG, mainly consider  recent failed pregnancy. Occult ectopic pregnancy not excluded. There is heterogeneous endometrial thickening without classic ultrasound features to suggest molar pregnancy. Trending of HCG and close clinical monitoring is recommended with repeat ultrasound as indicated Electronically Signed   By: Jasmine Pang M.D.   On: 11/29/2023 19:18   *Consult with Dr. Para March @ 1940 - notified of patient's  complaints, assessments, lab & U/S results, recommended tx plan to call on-call MD to suggest scheduling of D&E at - ok to d/c home, agrees with plan   Dr. Timothy Lasso on the unit @ 2000 - recommends Cytotec 800 mcg po in MAU -- Dr. Timothy Lasso will send a message to [facility]'s office to have the patient.   Assessment and Plan  1. Miscarriage at 8 to [redacted] weeks gestation (Primary) - Information provided on miscarriage   2. Retained products of conception after miscarriage - Specimen obtained form pelvic exam sent to pathology for testing since uterus is empty  3. Vaginal bleeding affecting early pregnancy - Discussed that taking Cytotec will cause increased VB and pain  4. Abdominal pain during pregnancy in first trimester  5. [redacted] weeks gestation of pregnancy   - Discharge home - F/U with [facility] tomorrow -- Dr. Timothy Lasso will coordinate - Patient verbalized an understanding of the plan of care and agrees.     Raelyn Mora, CNM 11/29/2023, 4:02 PM

## 2023-11-29 NOTE — MAU Note (Signed)
 Alexandria Ellis is a 33 y.o. at [redacted]w[redacted]d here in MAU reporting: pt pulled back directly to bed; pt was seen at urgent care and transferred here by EMS. She reports vaginal bleeding since yesterday. RN observed pt's underwear, and pants saturated with blood. Pt reports intense pelvic cramping which she rates a 10.   Onset of complaint: yesterday Pain score: 10/10 pelvis Vitals:   11/29/23 1646  BP: 125/77  Pulse: 78  Resp: 16  Temp: 98.2 F (36.8 C)  SpO2: 100%     FHT: deferred  Lab orders placed from triage: CNM at bedside; pelvic performed

## 2023-12-01 LAB — SURGICAL PATHOLOGY

## 2023-12-05 ENCOUNTER — Telehealth: Payer: Self-pay | Admitting: Obstetrics and Gynecology

## 2023-12-05 NOTE — Telephone Encounter (Signed)
 TC to Dr. 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 Dr. Mindi Slicker requested for pathology report to be forwarded to her and she would have it taken care of by the on-call provider in their office.  Raelyn Mora, CNM   12/05/2023  11:25 AM

## 2024-06-17 NOTE — Progress Notes (Signed)
 Urgent Care Evaluation- Service Date: 06/17/24 Assessment & Plan  1. Post-nasal drainage - Influenza Rapid Antigen Screen - SARS-CoV-2 (COVID-19) Antigen - Strep A Screen - pseudoePHEDrine  (SUDAFED) 30 mg PO TABS; Take 1 Tab by Mouth Every 6 Hours As Needed (PRN COLD SYMPTOMS) for up to 10 days.  Dispense: 30 Tab; Refill: 0  2. Acute pharyngitis, unspecified etiology - Throat Culture; Future - dexAMETHasone (Decadron) injection 10 mg      No follow-ups on file.   Chief Complaint      Patient presents with   COVID CONCERNS    THROAT FEELS SCRATCHY AND FEELING HOT. X1DAY AGO     History of Present Illness  This is a pleasant 33 year old who presents with sore throat, nasal congestion and slight cough for 2 days.  She is And vacation to [place].     Review of Systems  Review of Systems  Home Medications   Outpatient Medications Marked as Taking for the 06/17/24 encounter (Office Visit) with Bergen, Aileen E, PA  Medication Sig Dispense Refill   pseudoePHEDrine  (SUDAFED) 30 mg PO TABS Take 1 Tab by Mouth Every 6 Hours As Needed (PRN COLD SYMPTOMS) for up to 10 days. 30 Tab 0   Current Facility-Administered Medications for the 06/17/24 encounter (Office Visit) with Bergen, Aileen E, PA  Medication Dose Route Frequency Provider Last Rate Last Admin   dexAMETHasone (Decadron) injection 10 mg  10 mg Oral Once         Allergies   Allergies  Allergen Reactions   Keflet  [Cephalexin ] rash/itching    Past Surgical History  No past surgical history on file.   Past Medical History  No past medical history on file.  Family History  No family history on file.  Social History   Social History   Occupational History   Not on file  Tobacco Use   Smoking status: Never   Smokeless tobacco: Never  Vaping Use   Vaping status: Never Used  Substance and Sexual Activity   Alcohol use: Yes    Comment: OCC   Drug use: Never   Sexual activity: Not on file     E-Cigarette Use: Never User  Start Date:   Quit Date:   Passive Exposure:   Counseling Given:   Comments:   Nicotine:   THC:   CBD:   Flavoring:   Other:      The patient's medical, family, and social history (including tobacco usage) were reviewed and updated as appropriate.     Physical Exam  Vital Signs: BP 120/89 (Site: Arm Upper L, Position: Sitting, Cuff Size: Large)   Pulse 80   Temp 97 F (36.1 C) (Oral)   Resp 18   Ht 5' 8 (1.727 m)   Wt 97.5 kg (215 lb)   LMP 05/21/2024 (Exact Date)   SpO2 99%   BMI 32.69 kg/m    Physical Exam Vitals and nursing note reviewed.  Constitutional:      General: She is not in acute distress.    Appearance: Normal appearance. She is normal weight. She is not ill-appearing, toxic-appearing or diaphoretic.  HENT:     Head: Normocephalic and atraumatic.     Right Ear: Tympanic membrane normal.     Left Ear: Tympanic membrane normal.     Nose: Nose normal.     Mouth/Throat:     Pharynx: Posterior oropharyngeal erythema present. No oropharyngeal exudate.  Eyes:     Extraocular Movements: Extraocular movements intact.  Pupils: Pupils are equal, round, and reactive to light.  Cardiovascular:     Rate and Rhythm: Normal rate and regular rhythm.     Pulses: Normal pulses.     Heart sounds: Normal heart sounds.  Pulmonary:     Effort: Pulmonary effort is normal.     Breath sounds: Normal breath sounds.  Musculoskeletal:        General: Normal range of motion.     Cervical back: Normal range of motion and neck supple. No tenderness.  Lymphadenopathy:     Cervical: No cervical adenopathy.  Neurological:     Mental Status: She is alert.  Psychiatric:        Mood and Affect: Mood normal.        Behavior: Behavior normal.         Recent Results & Studies     Medical Decision Making  Complexity I have ordered other test and these include COVID, flu, strep negative.  Plan of care was discussed with the patient.   Other: Differential diagnosis  COVID, flu, Croup, bronchitis, pneumonia, STREP   URI symptoms for 2 days  COVID, STREP and Influenza testing is negative  Oral Decadron administered, patient tolerated procedure well Throat culture sent out Results to be in her MyChart  No resp distress, O2 sat stable on RA, lungs CTAB.  Low suspicion for pneumonia - CXR considered, but not pursued  Low suspicion for acute bacterial illness such as bacterial pneumonia, sinusitis, pharyngitis - antibiotics not indicated  Symptoms most consistent with a viral syndrome. Conservative management with OTC cough and cold medications.  Reviewed with patient plan for dispo home, symptom management, expected clinical course and return precautions. Encouraged follow up with their PCP. Patient states understanding and is comfortable with this plan.      Low clinical concern for peritonsillar abscess, deep space neck infection, or Ludewig's angina as patient has normal phonation w/o muffled voice, no trismus or TMJ tenderness, tolerating secretions, uvula midline, no fullness to floor of mouth, floor of mouth soft, nontender with range of motion of neck. Will not obtain labs or imaging of head and neck at this time.  .     I have reviewed information entered by the clinical staff and/or patient and verified it as accurate or edited where necessary.  Signature: ELMA FORBES REAR, PA [facility] Dept: [phone] Dept Fax: [phone]

## 2024-10-18 ENCOUNTER — Emergency Department (HOSPITAL_COMMUNITY)

## 2024-10-18 ENCOUNTER — Emergency Department (HOSPITAL_COMMUNITY)
Admission: EM | Admit: 2024-10-18 | Discharge: 2024-10-18 | Disposition: A | Discharge status: Home / Self Care | Attending: Emergency Medicine | Admitting: Emergency Medicine

## 2024-10-18 ENCOUNTER — Encounter (HOSPITAL_COMMUNITY): Payer: Self-pay

## 2024-10-18 ENCOUNTER — Other Ambulatory Visit: Payer: Self-pay

## 2024-10-18 DIAGNOSIS — R109 Unspecified abdominal pain: Secondary | ICD-10-CM | POA: Insufficient documentation

## 2024-10-18 DIAGNOSIS — D259 Leiomyoma of uterus, unspecified: Secondary | ICD-10-CM | POA: Insufficient documentation

## 2024-10-18 DIAGNOSIS — N939 Abnormal uterine and vaginal bleeding, unspecified: Secondary | ICD-10-CM | POA: Diagnosis present

## 2024-10-18 LAB — URINALYSIS, ROUTINE W REFLEX MICROSCOPIC
Bilirubin Urine: NEGATIVE
Glucose, UA: NEGATIVE mg/dL
Ketones, ur: NEGATIVE mg/dL
Nitrite: NEGATIVE
Protein, ur: NEGATIVE mg/dL
RBC / HPF: >50 RBC/hpf (ref 0–5)
Specific Gravity, Urine: 1.01 (ref 1.005–1.030)
pH: 5 (ref 5.0–8.0)

## 2024-10-18 LAB — CBC WITH DIFFERENTIAL/PLATELET
Abs Immature Granulocytes: 0.01 K/uL (ref 0.00–0.07)
Basophils Absolute: 0 K/uL (ref 0.0–0.1)
Basophils Relative: 1 %
Eosinophils Absolute: 0.1 K/uL (ref 0.0–0.5)
Eosinophils Relative: 2 %
HCT: 42.9 % (ref 36.0–46.0)
Hemoglobin: 13.7 g/dL (ref 12.0–15.0)
Immature Granulocytes: 0 %
Lymphocytes Relative: 36 %
Lymphs Abs: 2.1 K/uL (ref 0.7–4.0)
MCH: 29.7 pg (ref 26.0–34.0)
MCHC: 31.9 g/dL (ref 30.0–36.0)
MCV: 93.1 fL (ref 80.0–100.0)
Monocytes Absolute: 0.4 K/uL (ref 0.1–1.0)
Monocytes Relative: 7 %
Neutro Abs: 3.2 K/uL (ref 1.7–7.7)
Neutrophils Relative %: 54 %
Platelets: 280 K/uL (ref 150–400)
RBC: 4.61 MIL/uL (ref 3.87–5.11)
RDW: 13.3 % (ref 11.5–15.5)
WBC: 5.9 K/uL (ref 4.0–10.5)
nRBC: 0 % (ref 0.0–0.2)

## 2024-10-18 LAB — HCG, QUANTITATIVE, PREGNANCY: hCG, Beta Chain, Quant, S: 1 m[IU]/mL

## 2024-10-18 NOTE — ED Provider Notes (Signed)
 " [place] EMERGENCY DEPARTMENT AT [facility] Provider Note   CSN: [id number] Arrival date & time: 10/18/24  9385     Patient presents with: Vaginal Bleeding   Alexandria Ellis is a 34 y.o. female.  presents to the ER for vaginal bleeding that started this morning.  Last period was on September 13, 2024.  She took a home pregnancy test which is positive.  She was also seen at an urgent care.  Her pregnancy test was positive.  Patient [facility] [place] [facility] [place] [facility] [place] [employer] [facility] she was told that her pregnancy test was negative.  She then took multiple at home pregnancy test which were positive.  Patient noted spotting this morning which is increasing.  She admits to an abortion in 2022 and a miscarriage last year.  No other pregnancies.  Does not take any medicines on a regular basis.  Denies any bleeding disorders.  Denies fever, cough, chills, chest pain, shortness of breath, nausea, vomiting, vaginal discharge, abdominal pain, pelvic pain, diarrhea, weakness, numbness or tingling, headache, dizziness, or any other symptoms at this time.      Vaginal Bleeding      Prior to Admission medications  Medication Sig Start Date End Date Taking? Authorizing Provider  clotrimazole -betamethasone  (LOTRISONE ) cream Apply to affected area 2 times daily prn 06/07/19   Corlis Burnard DEL, NP  ondansetron  (ZOFRAN -ODT) 4 MG disintegrating tablet Take 1 tablet (4 mg total) by mouth every 8 (eight) hours as needed. 11/29/23   Letha Renshaw, CNM  oxycodone  (OXY-IR) 5 MG capsule Take 1 capsule (5 mg total) by mouth every 4 (four) hours as needed (breakthrough pain). 11/29/23   Dawson, Rolitta, CNM    Allergies: Cephalexin     Review of Systems  Genitourinary:  Positive for vaginal bleeding.    Updated Vital Signs BP 120/83 (BP Location: Left Arm)   Pulse 62   Temp 98 F (36.7 C) (Oral)   Resp 16   SpO2 100%   Physical Exam Vitals and nursing note reviewed. Exam conducted with a  chaperone present.  Constitutional:      General: She is not in acute distress.    Appearance: Normal appearance. She is well-developed. She is not ill-appearing, toxic-appearing or diaphoretic.  HENT:     Head: Normocephalic and atraumatic.  Eyes:     Conjunctiva/sclera: Conjunctivae normal.  Cardiovascular:     Rate and Rhythm: Normal rate and regular rhythm.     Heart sounds: No murmur heard. Pulmonary:     Effort: Pulmonary effort is normal. No respiratory distress.     Breath sounds: Normal breath sounds.  Abdominal:     Palpations: Abdomen is soft.     Tenderness: There is no abdominal tenderness.  Genitourinary:    General: Normal vulva.     Pubic Area: No rash or pubic lice.      Labia:        Right: No rash, tenderness, lesion or injury.        Left: No rash, tenderness, lesion or injury.      Vagina: Bleeding present.     Cervix: Normal.     Uterus: Normal.      Adnexa: Right adnexa normal.     Comments: Blood noted from the vaginal area Musculoskeletal:        General: No swelling.     Cervical back: Neck supple.  Skin:    General: Skin is warm and dry.     Capillary  Refill: Capillary refill takes less than 2 seconds.  Neurological:     Mental Status: She is alert.  Psychiatric:        Mood and Affect: Mood normal.     (all labs ordered are listed, but only abnormal results are displayed) Labs Reviewed  URINALYSIS, ROUTINE W REFLEX MICROSCOPIC - Abnormal; Notable for the following components:      Result Value   APPearance HAZY (*)    Hgb urine dipstick LARGE (*)    Leukocytes,Ua TRACE (*)    Bacteria, UA MANY (*)    All other components within normal limits  CBC WITH DIFFERENTIAL/PLATELET  HCG, QUANTITATIVE, PREGNANCY    EKG: None  Radiology: US  PELVIC COMPLETE WITH TRANSVAGINAL Result Date: 10/18/2024 CLINICAL DATA:  Vaginal bleeding. EXAM: TRANSABDOMINAL AND TRANSVAGINAL ULTRASOUND OF PELVIS TECHNIQUE: Both transabdominal and transvaginal  ultrasound examinations of the pelvis were performed. Transabdominal technique was performed for global imaging of the pelvis including uterus, ovaries, adnexal regions, and pelvic cul-de-sac. It was necessary to proceed with endovaginal exam following the transabdominal exam to visualize the uterus, endometrium, bilateral ovaries and bilateral adnexa. COMPARISON:  November 29, 2023 FINDINGS: Uterus Measurements: 8.7 cm x 5.9 cm x 6.1 cm = volume: 163.6 mL. A 2.7 cm x 2.2 cm x 3.1 cm heterogeneous uterine fibroid is seen within the anterior aspect of the uterus on the right. 1.4 cm x 1.0 cm x 1.2 cm and 0.9 cm x 0.7 cm x 0.7 cm uterine fibroids are also seen. Endometrium Thickness: 9.5 mm.  No focal abnormality visualized. Right ovary Measurements: 4.1 cm x 1.8 cm x 2.5 cm = volume: 9.6 mL. Several peripheral ovarian follicles are seen. Left ovary Measurements: 3.3 cm x 1.8 cm x 2.1 cm = volume: 6.5 mL. Several peripheral ovarian follicles are seen. Other findings A trace amount of fluid is seen within the endocervical canal. Of incidental note is the presence of prominent vessels noted along the lateral aspects of the uterus. IMPRESSION: 1. Multiple heterogeneous uterine fibroids. 2. Findings which may represent sequelae associated with pelvic congestion. Electronically Signed   By: Suzen Dials M.D.   On: 10/18/2024 10:40     Procedures   Medications Ordered in the ED - No data to display                                  Medical Decision Making Amount and/or Complexity of Data Reviewed Labs: ordered.    This patient presents to the ED for concern of vaginal bleeding. this involves an extensive number of treatment options, and is a complaint that carries with it a high risk of complications and morbidity.  The differential diagnosis includes ectopic pregnancy, uterine fibroids, abortion, DUB.     Additional history obtained:  Additional history obtained from EMR External records from  outside source obtained and reviewed including MAU note from 11/29/2023   Lab Tests:  I Ordered, and personally interpreted labs.  The pertinent results include: No leukocytosis on CBC, normal hCG.   Imaging Studies ordered:  I ordered imaging studies including US  pelvic complete with transvaginal I independently visualized and interpreted imaging which showed multiple uterine fibroids I agree with the radiologist interpretation   Cardiac Monitoring: / EKG:  The patient was maintained on a cardiac monitor.  I personally viewed and interpreted the cardiac monitored which showed an underlying rhythm of: Normal sinus     Dispostion: Patient is  a healthy 34 year old female presenting today with vaginal bleeding that started today.  Last menstrual cycle on September 13, 2024.  Patient took at home pregnancy test which was positive [place] [facility] [place] told that it was negative.  Denies abdominal pain or pelvic pain.  History of 1 abortion and miscarriage.  No other pregnancies.  Patient is alert and oriented with no apparent distress.  Well-appearing and appears comfortable on exam.  Abdomen soft nontender.  Lungs clear.  Heart normal.  Pelvic exam revealed blood from the vaginal vault.  Vital signs stable.  No signs of sepsis.  hCG quantitative level was 1 and CBC was negative.  Ultrasound pelvic with transvaginal revealed multiple heterogeneous uterine fibroids.  These findings were discussed with the patient.  I educated her that these are very common and benign at this time.  I informed the patient that she may have a miscarriage and that is why her pregnancy test is negative here today. I recommended the patient to follow-up with OB/GYN for further evaluation.  Hemodynamically stable.  Denies any abdominal or pelvic pain.  Patient is in agreement with plan and is stable for discharge.        Final diagnoses:  Uterine leiomyoma, unspecified location    ED  Discharge Orders     None          Braxton Dubois, DEVONNA 10/18/24 1213    Randol Simmonds, MD 10/18/24 1606  "

## 2024-10-18 NOTE — Discharge Instructions (Addendum)
 Your ultrasound showed fibroids in your uterus today.  These are benign growths in your muscle wall or uterus and very common.  Please follow-up with your OB/GYN for further evaluation of this.  Return for worsening bleeding, fevers, shortness of breath, severe abdominal pain, shortness of breath, chest pain, dizziness.  Maintain hydration.

## 2024-10-18 NOTE — ED Triage Notes (Signed)
 Pt reports last period December 19th, pt has several positive pregnancy tests with her. Pt is spotting pink discharge this morning.
